# Patient Record
Sex: Female | Born: 1995 | Race: Black or African American | Hispanic: No | Marital: Married | Smoking: Never smoker
Health system: Southern US, Community
[De-identification: ages and names within clinical notes are randomized; demographics above are authoritative.]

## PROBLEM LIST (undated history)

## (undated) ENCOUNTER — Inpatient Hospital Stay (HOSPITAL_COMMUNITY): Payer: 59

## (undated) DIAGNOSIS — J302 Other seasonal allergic rhinitis: Secondary | ICD-10-CM

## (undated) HISTORY — PX: TONSILLECTOMY: SUR1361

---

## 2019-07-11 ENCOUNTER — Emergency Department (INDEPENDENT_AMBULATORY_CARE_PROVIDER_SITE_OTHER)
Admission: EM | Admit: 2019-07-11 | Discharge: 2019-07-11 | Disposition: A | Discharge status: Home / Self Care | Payer: Federal, State, Local not specified - PPO | Source: Home / Self Care

## 2019-07-11 ENCOUNTER — Other Ambulatory Visit: Payer: Self-pay

## 2019-07-11 ENCOUNTER — Encounter: Payer: Self-pay | Admitting: Emergency Medicine

## 2019-07-11 DIAGNOSIS — Z23 Encounter for immunization: Secondary | ICD-10-CM

## 2019-07-11 MED ORDER — TETANUS-DIPHTH-ACELL PERTUSSIS 5-2.5-18.5 LF-MCG/0.5 IM SUSP
0.5000 mL | Freq: Once | INTRAMUSCULAR | Status: AC
  Administered 2019-07-11: 0.5 mL via INTRAMUSCULAR

## 2019-07-11 NOTE — ED Triage Notes (Signed)
Patient here for immunization for college; tdap.

## 2019-07-24 ENCOUNTER — Other Ambulatory Visit: Payer: Self-pay

## 2019-07-25 ENCOUNTER — Ambulatory Visit (HOSPITAL_COMMUNITY)
Admission: EM | Admit: 2019-07-25 | Discharge: 2019-07-25 | Disposition: A | Discharge status: Home / Self Care | Payer: Federal, State, Local not specified - PPO | Attending: Emergency Medicine | Admitting: Emergency Medicine

## 2019-07-25 ENCOUNTER — Encounter (HOSPITAL_COMMUNITY): Payer: Self-pay

## 2019-07-25 ENCOUNTER — Emergency Department (HOSPITAL_COMMUNITY)
Admission: EM | Admit: 2019-07-25 | Discharge: 2019-07-25 | Discharge status: Against Medical Advice | Payer: Federal, State, Local not specified - PPO

## 2019-07-25 DIAGNOSIS — F419 Anxiety disorder, unspecified: Secondary | ICD-10-CM | POA: Diagnosis not present

## 2019-07-25 DIAGNOSIS — R079 Chest pain, unspecified: Secondary | ICD-10-CM

## 2019-07-25 NOTE — Discharge Instructions (Addendum)
Go to the emergency department if you have chest pain, shortness of breath, weakness, or other concerning symptoms.

## 2019-07-25 NOTE — ED Triage Notes (Signed)
Patient presents to Urgent Care with complaints of right arm and neck pain since last night while at work. Patient reports she knows "sometimes that can be a sign of a heart attack so I wanted to get checked cause I'm too young and fabulous for that" pt endorses hx of anxiety.

## 2019-07-25 NOTE — ED Provider Notes (Signed)
[place]    CSN: [id number] Arrival date & time: 07/25/19  1859      History   Chief Complaint Chief Complaint  Patient presents with  . Anxiety    HPI Tamara Rios is a 23 y.o. female.   Patient presents with anxiety and stress related to working full-time and going to school full-time.  She states her anxiety is causing her to have intermittent chest pain x several months.  She reports when she has these episodes, she has shortness of breath also.  She denies current symptoms.  No treatments attempted at home.  The history is provided by the patient.    History reviewed. No pertinent past medical history.  There are no active problems to display for this patient.   Past Surgical History:  Procedure Laterality Date  . TONSILLECTOMY      OB History   No obstetric history on file.      Home Medications    Prior to Admission medications   Not on File    Family History Family History  Problem Relation Age of Onset  . Healthy Mother   . Hypertension Father     Social History Social History   Tobacco Use  . Smoking status: Never Smoker  . Smokeless tobacco: Never Used  Substance Use Topics  . Alcohol use: Yes  . Drug use: Yes    Types: Marijuana     Allergies   Patient has no known allergies.   Review of Systems Review of Systems  Constitutional: Negative for chills and fever.  HENT: Negative for ear pain and sore throat.   Eyes: Negative for pain and visual disturbance.  Respiratory: Positive for shortness of breath. Negative for cough.   Cardiovascular: Positive for chest pain. Negative for palpitations.  Gastrointestinal: Negative for abdominal pain and vomiting.  Genitourinary: Negative for dysuria and hematuria.  Musculoskeletal: Negative for arthralgias and back pain.  Skin: Negative for color change and rash.  Neurological: Negative for seizures and syncope.  Psychiatric/Behavioral: The patient is nervous/anxious.    All other systems reviewed and are negative.    Physical Exam Triage Vital Signs ED Triage Vitals  Enc Vitals Group     BP 07/25/19 1937 132/75     Pulse Rate 07/25/19 1937 81     Resp 07/25/19 1937 16     Temp 07/25/19 1937 98.7 F (37.1 C)     Temp Source 07/25/19 1937 Temporal     SpO2 07/25/19 1937 100 %     Weight --      Height --      Head Circumference --      Peak Flow --      Pain Score 07/25/19 1936 0     Pain Loc --      Pain Edu? --      Excl. in [place]? --    No data found.  Updated Vital Signs BP 132/75 (BP Location: Right Arm)   Pulse 81   Temp 98.7 F (37.1 C) (Temporal)   Resp 16   SpO2 100%   Visual Acuity Right Eye Distance:   Left Eye Distance:   Bilateral Distance:    Right Eye Near:   Left Eye Near:    Bilateral Near:     Physical Exam Vitals signs and nursing note reviewed.  Constitutional:      General: She is not in acute distress.    Appearance: She is well-developed.  HENT:  Head: Normocephalic and atraumatic.     Mouth/Throat:     Mouth: Mucous membranes are moist.     Pharynx: Oropharynx is clear.  Eyes:     Conjunctiva/sclera: Conjunctivae normal.  Neck:     Musculoskeletal: Neck supple.  Cardiovascular:     Rate and Rhythm: Normal rate and regular rhythm.     Heart sounds: Normal heart sounds. No murmur.  Pulmonary:     Effort: Pulmonary effort is normal. No respiratory distress.     Breath sounds: Normal breath sounds.  Abdominal:     General: Bowel sounds are normal.     Palpations: Abdomen is soft.     Tenderness: There is no abdominal tenderness. There is no guarding or rebound.  Skin:    General: Skin is warm and dry.     Findings: No rash.  Neurological:     General: No focal deficit present.     Mental Status: She is alert and oriented to person, place, and time.     Sensory: No sensory deficit.     Motor: No weakness.     Gait: Gait normal.      UC Treatments / Results  Labs (all labs ordered are  listed, but only abnormal results are displayed) Labs Reviewed - No data to display  EKG   Radiology No results found.  Procedures Procedures (including critical care time)  Medications Ordered in UC Medications - No data to display  Initial Impression / Assessment and Plan / UC Course  I have reviewed the triage vital signs and the nursing notes.  Pertinent labs & imaging results that were available during my care of the patient were reviewed by me and considered in my medical decision making (see chart for details).    Chest pain.  Anxiety.  EKG shows normal sinus rhythm, rate 88, no ST elevation.  Discussed with patient that she should go to the emergency department if she is having chest pain, shortness of breath, weakness, or other concerning symptoms.  Discussed that she should follow-up with her primary care provider to discuss her anxiety and stress.  Patient agrees to plan of care.     Final Clinical Impressions(s) / UC Diagnoses   Final diagnoses:  Chest pain, unspecified type  Anxiety     Discharge Instructions     Go to the emergency department if you have chest pain, shortness of breath, weakness, or other concerning symptoms.        ED Prescriptions    None     PDMP not reviewed this encounter.   Mickie Bail, NP 07/25/19 2029

## 2020-05-08 ENCOUNTER — Ambulatory Visit: Payer: Federal, State, Local not specified - PPO | Attending: Internal Medicine

## 2020-05-08 DIAGNOSIS — Z20822 Contact with and (suspected) exposure to covid-19: Secondary | ICD-10-CM

## 2020-05-09 LAB — NOVEL CORONAVIRUS, NAA: SARS-CoV-2, NAA: NOT DETECTED

## 2020-05-09 LAB — SARS-COV-2, NAA 2 DAY TAT

## 2020-09-26 ENCOUNTER — Other Ambulatory Visit: Payer: Self-pay

## 2020-09-26 ENCOUNTER — Ambulatory Visit (HOSPITAL_COMMUNITY)
Admission: EM | Admit: 2020-09-26 | Discharge: 2020-09-26 | Disposition: A | Discharge status: Home / Self Care | Payer: Federal, State, Local not specified - PPO

## 2021-03-11 ENCOUNTER — Emergency Department (HOSPITAL_BASED_OUTPATIENT_CLINIC_OR_DEPARTMENT_OTHER)
Admission: EM | Admit: 2021-03-11 | Discharge: 2021-03-12 | Disposition: A | Discharge status: Home / Self Care | Payer: Federal, State, Local not specified - PPO | Attending: Emergency Medicine | Admitting: Emergency Medicine

## 2021-03-11 ENCOUNTER — Other Ambulatory Visit: Payer: Self-pay

## 2021-03-11 ENCOUNTER — Encounter (HOSPITAL_BASED_OUTPATIENT_CLINIC_OR_DEPARTMENT_OTHER): Payer: Self-pay | Admitting: *Deleted

## 2021-03-11 DIAGNOSIS — N946 Dysmenorrhea, unspecified: Secondary | ICD-10-CM | POA: Insufficient documentation

## 2021-03-11 DIAGNOSIS — R531 Weakness: Secondary | ICD-10-CM | POA: Diagnosis present

## 2021-03-11 DIAGNOSIS — R5381 Other malaise: Secondary | ICD-10-CM

## 2021-03-11 DIAGNOSIS — R5383 Other fatigue: Secondary | ICD-10-CM

## 2021-03-11 LAB — URINALYSIS, MICROSCOPIC (REFLEX)

## 2021-03-11 LAB — BASIC METABOLIC PANEL
Anion gap: 7 (ref 5–15)
BUN: 5 mg/dL — ABNORMAL LOW (ref 6–20)
CO2: 26 mmol/L (ref 22–32)
Calcium: 8.9 mg/dL (ref 8.9–10.3)
Chloride: 105 mmol/L (ref 98–111)
Creatinine, Ser: 0.82 mg/dL (ref 0.44–1.00)
GFR, Estimated: >60 mL/min (ref >60)
Glucose, Bld: 131 mg/dL — ABNORMAL HIGH (ref 70–99)
Potassium: 3.8 mmol/L (ref 3.5–5.1)
Sodium: 138 mmol/L (ref 135–145)

## 2021-03-11 LAB — CBC WITH DIFFERENTIAL/PLATELET
Abs Immature Granulocytes: 0.03 10*3/uL (ref 0.00–0.07)
Basophils Absolute: 0.1 10*3/uL (ref 0.0–0.1)
Basophils Relative: 0 %
Eosinophils Absolute: 0.1 10*3/uL (ref 0.0–0.5)
Eosinophils Relative: 1 %
HCT: 33.5 % — ABNORMAL LOW (ref 36.0–46.0)
Hemoglobin: 11 g/dL — ABNORMAL LOW (ref 12.0–15.0)
Immature Granulocytes: 0 %
Lymphocytes Relative: 37 %
Lymphs Abs: 4.3 10*3/uL — ABNORMAL HIGH (ref 0.7–4.0)
MCH: 29.9 pg (ref 26.0–34.0)
MCHC: 32.8 g/dL (ref 30.0–36.0)
MCV: 91 fL (ref 80.0–100.0)
Monocytes Absolute: 0.7 10*3/uL (ref 0.1–1.0)
Monocytes Relative: 6 %
Neutro Abs: 6.3 10*3/uL (ref 1.7–7.7)
Neutrophils Relative %: 56 %
Platelets: 269 10*3/uL (ref 150–400)
RBC: 3.68 MIL/uL — ABNORMAL LOW (ref 3.87–5.11)
RDW: 13.9 % (ref 11.5–15.5)
WBC: 11.6 10*3/uL — ABNORMAL HIGH (ref 4.0–10.5)
nRBC: 0 % (ref 0.0–0.2)

## 2021-03-11 LAB — URINALYSIS, ROUTINE W REFLEX MICROSCOPIC
Bilirubin Urine: NEGATIVE
Glucose, UA: NEGATIVE mg/dL
Ketones, ur: NEGATIVE mg/dL
Nitrite: NEGATIVE
Protein, ur: NEGATIVE mg/dL
Specific Gravity, Urine: 1.01 (ref 1.005–1.030)
pH: 5.5 (ref 5.0–8.0)

## 2021-03-11 LAB — PREGNANCY, URINE: Preg Test, Ur: NEGATIVE

## 2021-03-11 MED ORDER — ONDANSETRON 4 MG PO TBDP
8.0000 mg | ORAL_TABLET | Freq: Once | ORAL | Status: AC
  Administered 2021-03-11: 8 mg via ORAL
  Filled 2021-03-11: qty 2

## 2021-03-11 NOTE — ED Provider Notes (Signed)
[facility] Provider Note: Tamara Dell, MD, FACEP  CSN: [id number] MRN: [id number] ARRIVAL: 03/11/21 at 2242 ROOM: MH02/MH02   CHIEF COMPLAINT  Weakness   HISTORY OF PRESENT ILLNESS  03/11/21 11:30 PM Tamara Rios is a 25 y.o. female with about 2 hours of generalized weakness, nausea and some lower abdominal cramping.  Symptoms are not severe, and she rates her abdominal pain is about a 5 out of 10.  She is currently on her menses.  She is having no vomiting, diarrhea or dysuria.  She feels like she might be dehydrated because she has not been drinking as much as usual.  She would like her "blood counts" checked.   History reviewed. No pertinent past medical history.  Past Surgical History:  Procedure Laterality Date  . TONSILLECTOMY      Family History  Problem Relation Age of Onset  . Healthy Mother   . Hypertension Father     Social History   Tobacco Use  . Smoking status: Never Smoker  . Smokeless tobacco: Never Used  Vaping Use  . Vaping Use: Never used  Substance Use Topics  . Alcohol use: Yes  . Drug use: Not Currently    Types: Marijuana    Prior to Admission medications   Medication Sig Start Date End Date Taking? Authorizing Provider  ondansetron (ZOFRAN ODT) 8 MG disintegrating tablet Take 1 tablet (8 mg total) by mouth every 8 (eight) hours as needed for nausea or vomiting. 03/12/21  Yes Naziah Portee, MD    Allergies Patient has no known allergies.   REVIEW OF SYSTEMS  Negative except as noted here or in the History of Present Illness.   PHYSICAL EXAMINATION  Initial Vital Signs Blood pressure (!) 150/98, pulse 81, temperature 98.3 F (36.8 C), temperature source Oral, resp. rate 18, height 5' 6.5" (1.689 m), weight 86.5 kg, last menstrual period 03/10/2021, SpO2 100 %.  Examination General: Well-developed, well-nourished female in no acute distress; appearance consistent with age of record HENT: normocephalic;  atraumatic Eyes: pupils equal, round and reactive to light; extraocular muscles intact Neck: supple Heart: regular rate and rhythm Lungs: clear to auscultation bilaterally Abdomen: soft; nondistended; nontender; bowel sounds present Extremities: No deformity; full range of motion; pulses normal Neurologic: Awake, alert and oriented; motor function intact in all extremities and symmetric; no facial droop Skin: Warm and dry Psychiatric: Normal mood and affect   RESULTS  Summary of this visit's results, reviewed and interpreted by myself:   EKG Interpretation  Date/Time:    Ventricular Rate:    PR Interval:    QRS Duration:   QT Interval:    QTC Calculation:   R Axis:     Text Interpretation:        Laboratory Studies: Results for orders placed or performed during the hospital encounter of 03/11/21 (from the past 24 hour(s))  Urinalysis, Routine w reflex microscopic Urine, Clean Catch     Status: Abnormal   Collection Time: 03/11/21 10:53 PM  Result Value Ref Range   Color, Urine YELLOW YELLOW   APPearance CLEAR CLEAR   Specific Gravity, Urine 1.010 1.005 - 1.030   pH 5.5 5.0 - 8.0   Glucose, UA NEGATIVE NEGATIVE mg/dL   Hgb urine dipstick LARGE (A) NEGATIVE   Bilirubin Urine NEGATIVE NEGATIVE   Ketones, ur NEGATIVE NEGATIVE mg/dL   Protein, ur NEGATIVE NEGATIVE mg/dL   Nitrite NEGATIVE NEGATIVE   Leukocytes,Ua TRACE (A) NEGATIVE  Pregnancy, urine     Status:  None   Collection Time: 03/11/21 10:53 PM  Result Value Ref Range   Preg Test, Ur NEGATIVE NEGATIVE  Urinalysis, Microscopic (reflex)     Status: Abnormal   Collection Time: 03/11/21 10:53 PM  Result Value Ref Range   RBC / HPF 6-10 0 - 5 RBC/hpf   WBC, UA 0-5 0 - 5 WBC/hpf   Bacteria, UA RARE (A) NONE SEEN   Squamous Epithelial / LPF 0-5 0 - 5  CBC with Differential/Platelet     Status: Abnormal   Collection Time: 03/11/21 11:35 PM  Result Value Ref Range   WBC 11.6 (H) 4.0 - 10.5 K/uL   RBC 3.68 (L) 3.87  - 5.11 MIL/uL   Hemoglobin 11.0 (L) 12.0 - 15.0 g/dL   HCT 15.7 (L) 26.2 - 03.5 %   MCV 91.0 80.0 - 100.0 fL   MCH 29.9 26.0 - 34.0 pg   MCHC 32.8 30.0 - 36.0 g/dL   RDW 59.7 41.6 - 38.4 %   Platelets 269 150 - 400 K/uL   nRBC 0.0 0.0 - 0.2 %   Neutrophils Relative % 56 %   Neutro Abs 6.3 1.7 - 7.7 K/uL   Lymphocytes Relative 37 %   Lymphs Abs 4.3 (H) 0.7 - 4.0 K/uL   Monocytes Relative 6 %   Monocytes Absolute 0.7 0.1 - 1.0 K/uL   Eosinophils Relative 1 %   Eosinophils Absolute 0.1 0.0 - 0.5 K/uL   Basophils Relative 0 %   Basophils Absolute 0.1 0.0 - 0.1 K/uL   Immature Granulocytes 0 %   Abs Immature Granulocytes 0.03 0.00 - 0.07 K/uL  Basic metabolic panel     Status: Abnormal   Collection Time: 03/11/21 11:35 PM  Result Value Ref Range   Sodium 138 135 - 145 mmol/L   Potassium 3.8 3.5 - 5.1 mmol/L   Chloride 105 98 - 111 mmol/L   CO2 26 22 - 32 mmol/L   Glucose, Bld 131 (H) 70 - 99 mg/dL   BUN 5 (L) 6 - 20 mg/dL   Creatinine, Ser 5.36 0.44 - 1.00 mg/dL   Calcium 8.9 8.9 - 46.8 mg/dL   GFR, Estimated >03 >21 mL/min   Anion gap 7 5 - 15   Imaging Studies: No results found.  ED COURSE and MDM  Nursing notes, initial and subsequent vitals signs, including pulse oximetry, reviewed and interpreted by myself.  Vitals:   03/11/21 2249 03/11/21 2255 03/11/21 2356  BP:  (!) 150/98 (!) 147/89  Pulse:  81 80  Resp:  18 17  Temp:  98.3 F (36.8 C) 98.3 F (36.8 C)  TempSrc:  Oral Oral  SpO2:  100% 99%  Weight: 86.5 kg    Height: 5' 6.5" (1.689 m)     Medications  ondansetron (ZOFRAN-ODT) disintegrating tablet 8 mg (8 mg Oral Given 03/11/21 2354)   Patient advised of laboratory findings and slightly elevated white count.  This could indicate an early viral illness especially given the abdominal cramping.  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   PROCEDURES  Procedures   ED DIAGNOSES     ICD-10-CM   1. Malaise and  fatigue  R53.81    R53.83        Blandon Offerdahl, Jonny Ruiz, MD 03/12/21 [phone]

## 2021-03-11 NOTE — ED Triage Notes (Signed)
Lightheaded and weak for an hour.  Nausea. Abdominal pain.

## 2021-03-12 MED ORDER — ONDANSETRON 8 MG PO TBDP: 8.0000 mg | ORAL_TABLET | Freq: Three times a day (TID) | ORAL | 0 refills | Status: DC | PRN

## 2021-04-27 ENCOUNTER — Emergency Department (HOSPITAL_BASED_OUTPATIENT_CLINIC_OR_DEPARTMENT_OTHER): Payer: Federal, State, Local not specified - PPO

## 2021-04-27 ENCOUNTER — Encounter (HOSPITAL_BASED_OUTPATIENT_CLINIC_OR_DEPARTMENT_OTHER): Payer: Self-pay | Admitting: Emergency Medicine

## 2021-04-27 ENCOUNTER — Emergency Department (HOSPITAL_BASED_OUTPATIENT_CLINIC_OR_DEPARTMENT_OTHER)
Admission: EM | Admit: 2021-04-27 | Discharge: 2021-04-27 | Disposition: A | Discharge status: Home / Self Care | Payer: Federal, State, Local not specified - PPO | Attending: Emergency Medicine | Admitting: Emergency Medicine

## 2021-04-27 ENCOUNTER — Other Ambulatory Visit: Payer: Self-pay

## 2021-04-27 DIAGNOSIS — R1013 Epigastric pain: Secondary | ICD-10-CM | POA: Insufficient documentation

## 2021-04-27 DIAGNOSIS — R079 Chest pain, unspecified: Secondary | ICD-10-CM | POA: Insufficient documentation

## 2021-04-27 NOTE — ED Provider Notes (Signed)
[facility] EMERGENCY DEPARTMENT Provider Note   CSN: [id number] Arrival date & time: 04/27/21  2207     History Chief Complaint  Patient presents with   Chest Pain    Tamara Rios is a 25 y.o. female.  25 yo F with a chief complaints of left-sided chest pain this is pinpoint and sharp.  Seems to come and go last for seconds at a time.  Has been off and on for a little while now.  Nothing she does seems to make it better or worse.  She denies any issues with exercise denies any difficulty breathing with this denies cough congestion or fever.  Patient denies history of MI, denies hypertension hyperlipidemia diabetes or smoking.  Father had an MI in his 75s. Patient denies history of PE or DVT denies hemoptysis denies unilateral lower extremity edema denies recent surgery immobilization hospitalization estrogen use or history of cancer.    The history is provided by the patient and a friend.  Chest Pain Pain location:  L lateral chest Pain quality: sharp   Pain radiates to:  Does not radiate Pain severity:  Moderate Onset quality:  Gradual Duration:  2 weeks Timing:  Intermittent Progression:  Waxing and waning Chronicity:  New Relieved by:  Nothing Worsened by:  Nothing Ineffective treatments:  None tried Associated symptoms: no dizziness, no fever, no headache, no nausea, no palpitations, no shortness of breath and no vomiting       History reviewed. No pertinent past medical history.  There are no problems to display for this patient.   Past Surgical History:  Procedure Laterality Date   TONSILLECTOMY       OB History   No obstetric history on file.     Family History  Problem Relation Age of Onset   Healthy Mother    Hypertension Father     Social History   Tobacco Use   Smoking status: Never   Smokeless tobacco: Never  Vaping Use   Vaping Use: Never used  Substance Use Topics   Alcohol use: Yes   Drug use: Not Currently    Types:  Marijuana    Home Medications Prior to Admission medications   Medication Sig Start Date End Date Taking? Authorizing Provider  ondansetron (ZOFRAN ODT) 8 MG disintegrating tablet Take 1 tablet (8 mg total) by mouth every 8 (eight) hours as needed for nausea or vomiting. 03/12/21   Molpus, Jonny Ruiz, MD    Allergies    Patient has no known allergies.  Review of Systems   Review of Systems  Constitutional:  Negative for chills and fever.  HENT:  Negative for congestion and rhinorrhea.   Eyes:  Negative for redness and visual disturbance.  Respiratory:  Negative for shortness of breath and wheezing.   Cardiovascular:  Positive for chest pain. Negative for palpitations.  Gastrointestinal:  Negative for nausea and vomiting.  Genitourinary:  Negative for dysuria and urgency.  Musculoskeletal:  Negative for arthralgias and myalgias.  Skin:  Negative for pallor and wound.  Neurological:  Negative for dizziness and headaches.   Physical Exam Updated Vital Signs BP (!) 143/80 (BP Location: Left Arm)   Pulse 86   Temp 98.4 F (36.9 C) (Oral)   Resp 16   Ht 5' 6.5" (1.689 m)   Wt 81.6 kg   LMP 04/07/2021   SpO2 100%   BMI 28.62 kg/m   Physical Exam Vitals and nursing note reviewed.  Constitutional:      General: She  is not in acute distress.    Appearance: She is well-developed. She is not diaphoretic.  HENT:     Head: Normocephalic and atraumatic.  Eyes:     Pupils: Pupils are equal, round, and reactive to light.  Cardiovascular:     Rate and Rhythm: Normal rate and regular rhythm.     Heart sounds: No murmur heard.   No friction rub. No gallop.  Pulmonary:     Effort: Pulmonary effort is normal.     Breath sounds: No wheezing or rales.  Chest:     Chest wall: No tenderness.  Abdominal:     General: There is no distension.     Palpations: Abdomen is soft.     Tenderness: There is no abdominal tenderness.  Musculoskeletal:        General: No tenderness.     Cervical back:  Normal range of motion and neck supple.  Skin:    General: Skin is warm and dry.  Neurological:     Mental Status: She is alert and oriented to person, place, and time.  Psychiatric:        Behavior: Behavior normal.    ED Results / Procedures / Treatments   Labs (all labs ordered are listed, but only abnormal results are displayed) Labs Reviewed - No data to display  EKG EKG Interpretation  Date/Time:  Sunday April 27 2021 22:13:01 EDT Ventricular Rate:  89 PR Interval:  174 QRS Duration: 106 QT Interval:  350 QTC Calculation: 425 R Axis:   10 Text Interpretation: Normal sinus rhythm Incomplete right bundle branch block Borderline ECG No significant change since last tracing Confirmed by Rogina Schiano ([id number]) on 04/27/2021 10:31:37 PM  Radiology DG Chest Port 1 View  Result Date: 04/27/2021 CLINICAL DATA:  Chest pain EXAM: PORTABLE CHEST 1 VIEW COMPARISON:  None. FINDINGS: The heart size and mediastinal contours are within normal limits. Both lungs are clear. The visualized skeletal structures are unremarkable. IMPRESSION: No active disease. Electronically Signed   By: Kim  Fujinaga M.D.   On: 04/27/2021 22:39    Procedures Procedures   Medications Ordered in ED Medications - No data to display  ED Course  I have reviewed the triage vital signs and the nursing notes.  Pertinent labs & imaging results that were available during my care of the patient were reviewed by me and considered in my medical decision making (see chart for details).    MDM Rules/Calculators/A&P                          25  yo F with a chief complaints of left-sided chest pain.  This is sharp and intermittent lasting for seconds at a time.  She is well-appearing and nontoxic.  Chest x-ray viewed by me without focal infiltrate or pneumothorax.  EKG unremarkable.  She has no risk factors for heart disease.  I feel this is extremely unlikely for her to have an MI and do not feel that blood work is  warranted.  Patient is also PERC negative.  We will treat as possible reflux.  PCP follow-up.  When I brought up the possibility of reflux disease as problems she did tell me she has been trying to aggressively change her diet to get her bowels to move and has had some epigastric stomach discomfort off and on.  She is requesting referral to GI.  10:58 PM:  I have discussed the diagnosis/risks/treatment options with the patient and friend  and believe the pt to be eligible for discharge home to follow-up with. We also discussed returning to the ED immediately if new or worsening sx occur. We discussed the sx which are most concerning (e.g., sudden worsening pain, fever, inability to tolerate by mouth) that necessitate immediate return. Medications administered to the patient during their visit and any new prescriptions provided to the patient are listed below.  Medications given during this visit Medications - No data to display   The patient appears reasonably screen and/or stabilized for discharge and I doubt any other medical condition or other [facility] requiring further screening, evaluation, or treatment in the ED at this time prior to discharge.   Final Clinical Impression(s) / ED Diagnoses Final diagnoses:  Nonspecific chest pain    Rx / DC Orders ED Discharge Orders     None        Melene Plan, DO 04/27/21 2258

## 2021-04-27 NOTE — ED Triage Notes (Signed)
Reports left sided chest pain that is sharp in nature that has been going on for several days.  Denies any sob.  Does endorse some nausea.

## 2021-04-27 NOTE — Discharge Instructions (Addendum)
Try pepcid or tagamet up to twice a day.  Try to avoid things that may make this worse, most commonly these are spicy foods tomato based products fatty foods chocolate and peppermint.  Alcohol and tobacco can also make this worse.  Return to the emergency department for sudden worsening pain fever or inability to eat or drink.  

## 2021-05-09 ENCOUNTER — Encounter: Payer: Self-pay | Admitting: Obstetrics & Gynecology

## 2021-05-09 ENCOUNTER — Ambulatory Visit: Payer: Federal, State, Local not specified - PPO | Admitting: Obstetrics & Gynecology

## 2021-05-09 ENCOUNTER — Other Ambulatory Visit: Payer: Self-pay

## 2021-05-09 VITALS — BP 125/78 | HR 73 | Wt 188.0 lb

## 2021-05-09 DIAGNOSIS — Z3202 Encounter for pregnancy test, result negative: Secondary | ICD-10-CM | POA: Diagnosis not present

## 2021-05-09 DIAGNOSIS — Z3009 Encounter for other general counseling and advice on contraception: Secondary | ICD-10-CM | POA: Diagnosis not present

## 2021-05-09 DIAGNOSIS — Z30017 Encounter for initial prescription of implantable subdermal contraceptive: Secondary | ICD-10-CM

## 2021-05-09 LAB — POCT URINE PREGNANCY: Preg Test, Ur: NEGATIVE

## 2021-05-09 MED ORDER — ETONOGESTREL 68 MG ~~LOC~~ IMPL
68.0000 mg | DRUG_IMPLANT | Freq: Once | SUBCUTANEOUS | Status: AC
  Administered 2021-05-09: 68 mg via SUBCUTANEOUS

## 2021-05-09 NOTE — Progress Notes (Signed)
History:  25 y.o. G0. Pt here to discuss contraception. She had a well woman exam recently in [place]. She was recently married and wants to get back on contraception. She prev had the Nexplanon which worked well for her.  She wants to complete school and travel before having children.   The following portions of the patient's history were reviewed and updated as appropriate: allergies, current medications, past family history, past medical history, past social history, past surgical history and problem list.  Review of Systems:  Pertinent items are noted in HPI.    Objective:  Physical Exam Blood pressure 125/78, pulse 73, weight 188 lb (85.3 kg), last menstrual period 05/06/2021.  CONSTITUTIONAL: Well-developed, well-nourished female in no acute distress.  HENT:  Normocephalic, atraumatic EYES: Conjunctivae and EOM are normal. No scleral icterus.  NECK: Normal range of motion SKIN: Skin is warm and dry. No rash noted. Not diaphoretic.No pallor. NEUROLGIC: Alert and oriented to person, place, and time. Normal coordination.  Pelvic: not done  Patient given informed consent, she signed consent form. Pregnancy test was negative.  Appropriate time out taken.  Patient's left arm was prepped and draped in the usual sterile fashion.. The ruler used to measure and mark insertion area.  Patient was prepped with alcohol swab and then injected with 5 ml of 1 % lidocaine.  She was prepped with betadine, Nexplanon removed from packaging,  Device confirmed in needle, then inserted full length of needle and withdrawn per handbook instructions.  There was minimal blood loss.  Patient insertion site covered with guaze and a pressure bandage to reduce any bruising.      Assessment & Plan:  Contraception management   The patient tolerated the procedure well and was given post procedure instructions. Return in about one month prn  for Nexplanon check.  Evon Lopezperez L. Harraway-Smith, M.D., [place]

## 2021-05-09 NOTE — Progress Notes (Signed)
Discuss birth control options 

## 2021-05-09 NOTE — Addendum Note (Signed)
Addended by: Cheree Ditto, Francile Woolford A on: 05/09/2021 10:44 AM   Modules accepted: Orders

## 2021-05-13 ENCOUNTER — Encounter: Payer: Self-pay | Admitting: General Practice

## 2021-05-13 ENCOUNTER — Other Ambulatory Visit: Payer: Self-pay

## 2021-05-13 ENCOUNTER — Ambulatory Visit (INDEPENDENT_AMBULATORY_CARE_PROVIDER_SITE_OTHER): Payer: Federal, State, Local not specified - PPO

## 2021-05-13 VITALS — BP 129/73 | HR 81 | Wt 188.0 lb

## 2021-05-13 DIAGNOSIS — Z30011 Encounter for initial prescription of contraceptive pills: Secondary | ICD-10-CM | POA: Diagnosis not present

## 2021-05-13 DIAGNOSIS — Z3046 Encounter for surveillance of implantable subdermal contraceptive: Secondary | ICD-10-CM

## 2021-05-13 MED ORDER — DESOGESTREL-ETHINYL ESTRADIOL 0.15-0.02/0.01 MG (21/5) PO TABS: 1.0000 | ORAL_TABLET | Freq: Every day | ORAL | 3 refills | Status: DC

## 2021-05-13 NOTE — Progress Notes (Signed)
GYNECOLOGY OFFICE VISIT NOTE  History:  Tamara Rios is a 25 y.o. [phone] here today for Nexplanon removal. She requests removal due to pain and swelling at the insertion site.  She reports this has not occurred in the past and she does not like said side effect.  Patient states she would like to start oral contraception. She reports last sexual encounter was yesterday. Patient denies any abnormal vaginal discharge, bleeding, pelvic pain or other concerns.   No past medical history on file.  Past Surgical History:  Procedure Laterality Date   TONSILLECTOMY      The following portions of the patient's history were reviewed and updated as appropriate: allergies, current medications, past family history, past medical history, past social history, past surgical history and problem list.   Health Maintenance:  No pap on file.  No mammogram history d/t age.   Review of Systems:  General ROS: negative  Genito-Urinary ROS: no dysuria, trouble voiding, or hematuria  Objective:  Vitals: BP 129/73   Pulse 81   Wt 188 lb (85.3 kg)   LMP 05/06/2021   BMI 29.89 kg/m   Physical Exam Exam conducted with a chaperone present.  Constitutional:      Appearance: Normal appearance.  HENT:     Head: Normocephalic and atraumatic.  Eyes:     Conjunctiva/sclera: Conjunctivae normal.  Cardiovascular:     Rate and Rhythm: Normal rate.  Pulmonary:     Effort: Pulmonary effort is normal. No respiratory distress.  Musculoskeletal:        General: Normal range of motion.     Cervical back: Normal range of motion.  Skin:    General: Skin is warm and dry.     Findings: Bruising (Left arm in forcep area; reddish yellow. Nontender.  Swelling noted) present.  Neurological:     Mental Status: She is alert and oriented to person, place, and time.  Psychiatric:        Mood and Affect: Mood normal.        Behavior: Behavior normal.        Thought Content: Thought content normal.     Nexplanon  Removal Procedure:  Tamara Rios requests removal of Nexplanon for pain and discomfort at insertion site.  She understands that without another form of birth control method pregnancy can ensue immediately after removal. Patient also understands that removal can result in blood loss, infection at removal site, and pain.  Patient verbalizes understanding for all risks as stated above and in the consent and desires to proceed with removal.   Appropriate time out taken.  Patient identified, informed consent performed, consent signed.  Nexplanon site identified; left arm.  Area prepped in usual sterile fashon. 1.5 ml of 1% lidocaine with epi was used to anesthetize the area at the distal end of the implant. A small stab incision was made right beside the implant on the distal portion.  The Nexplanon rod was visualized within the sheath. The sheath was gently removed with the scalpel, resulting in the Nexplanon ejecting from the site without difficulty. There was minimal blood loss and no complications.  Steri-strips were applied over the small incision.  A pressure bandage was applied to reduce any further bruising.   The patient tolerated the procedure well and was given post procedure instructions to include: *Remove of pressure dressing and band-aid in 12-24 hours. *Remove of steri-strips after no more than 7 days. *Condom usage encouraged as patient does not desire contraception at this time. *  Tylenol or ibuprofen for insertion pain/discomfort. *Call for any other questions, concerns, or complications.   Assessment & Plan:  25 year old Nexplanon Removal Initiation of Oral Contraception   -Cautioned that bruising and swelling may continue despite removal -Instructed to start oral contraception today. -Cautioned that pregnancy can occur considering last sex yesterday and removal today. -Rx for Mircette sent to pharmacy on file. Disp 1, RF 3 -Plan to follow up in one month for BP check with  refill on BC Pill if happy with method. -Patient also in need of pap smear as no records.   Total face-to-face time with patient: 15 minutes   Cherre Robins MSN, CNM 05/13/2021

## 2021-06-09 ENCOUNTER — Ambulatory Visit: Payer: Federal, State, Local not specified - PPO

## 2021-06-19 ENCOUNTER — Ambulatory Visit (INDEPENDENT_AMBULATORY_CARE_PROVIDER_SITE_OTHER): Payer: Federal, State, Local not specified - PPO

## 2021-06-19 ENCOUNTER — Other Ambulatory Visit: Payer: Self-pay

## 2021-06-19 ENCOUNTER — Other Ambulatory Visit (HOSPITAL_COMMUNITY)
Admission: RE | Admit: 2021-06-19 | Discharge: 2021-06-19 | Disposition: A | Discharge status: Home / Self Care | Payer: Federal, State, Local not specified - PPO | Source: Ambulatory Visit | Attending: Family Medicine | Admitting: Family Medicine

## 2021-06-19 VITALS — BP 128/83 | HR 86 | Ht 67.0 in | Wt 189.0 lb

## 2021-06-19 DIAGNOSIS — N898 Other specified noninflammatory disorders of vagina: Secondary | ICD-10-CM

## 2021-06-19 DIAGNOSIS — R300 Dysuria: Secondary | ICD-10-CM

## 2021-06-19 NOTE — Progress Notes (Signed)
SUBJECTIVE: Tamara Rios is a 25 y.o. female who complains of urinary frequency, urgency and dysuria x 3 days, without flank pain, fever, chills,or bleeding. Pt requests to be checked for BV and yeast. Pt states she has some discharge but denies odor.  OBJECTIVE: Appears well, in no apparent distress.  Vital signs are normal. Urine dipstick shows positive for WBC's.    ASSESSMENT: Burning with urination.  PLAN: Pt declines STI testing. Wet prep and Urine culture was sent to the lab.Treatment per orders.  Call or return to clinic prn if these symptoms worsen or fail to improve as anticipated. 3

## 2021-06-22 LAB — URINE CULTURE

## 2021-06-22 NOTE — Progress Notes (Signed)
Chart reviewed - agree with CMA/RN documentation.  ° °

## 2021-06-25 LAB — CERVICOVAGINAL ANCILLARY ONLY
Bacterial Vaginitis (gardnerella): POSITIVE — AB
Candida Glabrata: NEGATIVE
Candida Vaginitis: POSITIVE — AB
Comment: NEGATIVE
Comment: NEGATIVE
Comment: NEGATIVE

## 2021-06-26 MED ORDER — FLUCONAZOLE 150 MG PO TABS: 150.0000 mg | ORAL_TABLET | Freq: Once | ORAL | 3 refills | Status: AC

## 2021-06-26 MED ORDER — METRONIDAZOLE 500 MG PO TABS: 500.0000 mg | ORAL_TABLET | Freq: Two times a day (BID) | ORAL | 0 refills | Status: DC

## 2021-08-21 ENCOUNTER — Other Ambulatory Visit: Payer: Self-pay

## 2021-08-21 ENCOUNTER — Ambulatory Visit
Admission: EM | Admit: 2021-08-21 | Discharge: 2021-08-21 | Disposition: A | Discharge status: Home / Self Care | Payer: Federal, State, Local not specified - PPO | Attending: Emergency Medicine | Admitting: Emergency Medicine

## 2021-08-21 DIAGNOSIS — R8290 Unspecified abnormal findings in urine: Secondary | ICD-10-CM

## 2021-08-21 DIAGNOSIS — R110 Nausea: Secondary | ICD-10-CM | POA: Diagnosis present

## 2021-08-21 DIAGNOSIS — K2100 Gastro-esophageal reflux disease with esophagitis, without bleeding: Secondary | ICD-10-CM | POA: Diagnosis not present

## 2021-08-21 LAB — POCT URINALYSIS DIP (MANUAL ENTRY)
Bilirubin, UA: NEGATIVE
Blood, UA: NEGATIVE
Glucose, UA: NEGATIVE mg/dL
Ketones, POC UA: NEGATIVE mg/dL
Leukocytes, UA: NEGATIVE
Nitrite, UA: NEGATIVE
Protein Ur, POC: NEGATIVE mg/dL
Spec Grav, UA: 1.015 (ref 1.010–1.025)
Urobilinogen, UA: 0.2 E.U./dL — AB
pH, UA: 8.5 — AB (ref 5.0–8.0)

## 2021-08-21 LAB — POCT URINE PREGNANCY: Preg Test, Ur: NEGATIVE

## 2021-08-21 MED ORDER — IPRATROPIUM BROMIDE 0.03 % NA SOLN
2.0000 | Freq: Two times a day (BID) | NASAL | 0 refills | Status: DC
Start: 2021-08-21 — End: 2021-09-26

## 2021-08-21 MED ORDER — LANSOPRAZOLE 30 MG PO CPDR: 30.0000 mg | DELAYED_RELEASE_CAPSULE | Freq: Every day | ORAL | 2 refills | Status: DC

## 2021-08-21 MED ORDER — FLUTICASONE PROPIONATE 50 MCG/ACT NA SUSP: 2.0000 | Freq: Every day | NASAL | 0 refills | Status: DC

## 2021-08-21 NOTE — ED Triage Notes (Signed)
Pt reports having nausea and lightheadedness, acid reflux. Pt also reports having some abd discomfort at times.   Started: a few weeks ago

## 2021-08-21 NOTE — ED Provider Notes (Addendum)
[facility]    CSN: [id number] Arrival date & time: 08/21/21  1515   History   Chief Complaint Chief Complaint  Patient presents with   Nausea   Dizziness   HPI Tamara Rios is a 25 y.o. female. Pt reports having nausea and lightheadedness, acid reflux for the past several weeks. Pt also reports having some abd discomfort at times.  Patient reports a history of gastroesophageal reflux disease previously on medication which she discontinued when she made lifestyle modifications.  Patient states she is married, currently sexually active and not using any form of birth control.  Patient reports a history of allergies however states they occur in the spring.  Urine pregnancy test today is negative.  Patient had a significantly elevated pH on urinalysis along with bilirubin in her urine.  Patient denies a history of kidney disease.  The history is provided by the patient.   History reviewed. No pertinent past medical history. There are no problems to display for this patient.  Past Surgical History:  Procedure Laterality Date   TONSILLECTOMY     OB History   No obstetric history on file.    Home Medications    Prior to Admission medications   Medication Sig Start Date End Date Taking? Authorizing Provider  fluticasone (FLONASE) 50 MCG/ACT nasal spray Place 2 sprays into both nostrils daily. 08/21/21 09/20/21 Yes Theadora Rama Scales, PA-C  ipratropium (ATROVENT) 0.03 % nasal spray Place 2 sprays into both nostrils every 12 (twelve) hours. 08/21/21  Yes Theadora Rama Scales, PA-C  lansoprazole (PREVACID) 30 MG capsule Take 1 capsule (30 mg total) by mouth daily with breakfast. 08/21/21 11/19/21 Yes Theadora Rama Scales, PA-C   Family History Family History  Problem Relation Age of Onset   Healthy Mother    Hypertension Father    Social History Social History   Tobacco Use   Smoking status: Never   Smokeless tobacco: Never  Vaping Use   Vaping Use:  Never used  Substance Use Topics   Alcohol use: Yes   Drug use: Not Currently    Types: Marijuana    Comment: Patient states she discontinued marijuana use 3 years ago   Allergies   Peanut-containing drug products  Review of Systems Review of Systems Pertinent findings noted in history of present illness.   Physical Exam Triage Vital Signs ED Triage Vitals  Enc Vitals Group     BP 08/08/21 0827 (!) 147/82     Pulse Rate 08/08/21 0827 72     Resp 08/08/21 0827 18     Temp 08/08/21 0827 98.3 F (36.8 C)     Temp Source 08/08/21 0827 Oral     SpO2 08/08/21 0827 98 %     Weight --      Height --      Head Circumference --      Peak Flow --      Pain Score 08/08/21 0826 5     Pain Loc --      Pain Edu? --      Excl. in GC? --    No data found.  Updated Vital Signs BP 123/83 (BP Location: Left Arm)   Pulse 92   Temp 98.4 F (36.9 C) (Oral)   Resp 18   LMP 08/10/2021   SpO2 98%   Visual Acuity Right Eye Distance:   Left Eye Distance:   Bilateral Distance:    Right Eye Near:   Left Eye Near:    Bilateral  Near:     Physical Exam Vitals and nursing note reviewed.  Constitutional:      General: She is not in acute distress.    Appearance: Normal appearance. She is not ill-appearing.  HENT:     Head: Normocephalic and atraumatic.  Eyes:     General: Lids are normal.        Right eye: No discharge.        Left eye: No discharge.     Extraocular Movements: Extraocular movements intact.     Conjunctiva/sclera: Conjunctivae normal.     Right eye: Right conjunctiva is not injected.     Left eye: Left conjunctiva is not injected.  Neck:     Trachea: Trachea and phonation normal.  Cardiovascular:     Rate and Rhythm: Normal rate and regular rhythm.     Pulses: Normal pulses.     Heart sounds: Normal heart sounds. No murmur heard.   No friction rub. No gallop.  Pulmonary:     Effort: Pulmonary effort is normal. No accessory muscle usage, prolonged expiration  or respiratory distress.     Breath sounds: Normal breath sounds. No stridor, decreased air movement or transmitted upper airway sounds. No decreased breath sounds, wheezing, rhonchi or rales.  Chest:     Chest wall: No tenderness.  Abdominal:     General: Abdomen is flat. Bowel sounds are normal. There is no distension.     Palpations: Abdomen is soft.     Tenderness: There is no abdominal tenderness. There is no right CVA tenderness or left CVA tenderness.     Hernia: No hernia is present.  Musculoskeletal:        General: Normal range of motion.     Cervical back: Normal range of motion and neck supple. Normal range of motion.  Lymphadenopathy:     Cervical: No cervical adenopathy.  Skin:    General: Skin is warm and dry.     Findings: No erythema or rash.  Neurological:     General: No focal deficit present.     Mental Status: She is alert and oriented to person, place, and time.  Psychiatric:        Mood and Affect: Mood normal.        Behavior: Behavior normal.   UC Treatments / Results  Labs (all labs ordered are listed, but only abnormal results are displayed)  Labs Reviewed  POCT URINALYSIS DIP (MANUAL ENTRY) - Abnormal; Notable for the following components:      Result Value   pH, UA 8.5 (*)    Urobilinogen, UA 0.2 (*)    All other components within normal limits  COVID-19, FLU A+B NAA  URINE CULTURE  CBC WITH DIFFERENTIAL/PLATELET  COMPREHENSIVE METABOLIC PANEL  POCT URINE PREGNANCY    EKG  Radiology No results found.  Procedures Procedures (including critical care time)  Medications Ordered in UC Medications - No data to display  Initial Impression / Assessment and Plan / UC Course  I have reviewed the triage vital signs and the nursing notes.  Pertinent labs & imaging results that were available during my care of the patient were reviewed by me and considered in my medical decision making (see chart for details).      Physical exam that is  unremarkable.  Patient was advised of her abnormal urinalysis results.  CMP and CBC were performed, patient advised to be notified of those results as well and if treatment is needed with that will be  provided for as well.  I have initiated a request to help her find primary care provider.  I also prescribed patient some Flonase and Atrovent to treat her allergic rhinitis and advised her to begin a 53-month course of Prevacid to get her heartburn under control.  Final Clinical Impressions(s) / UC Diagnoses   Final diagnoses:  Nausea without vomiting  Gastroesophageal reflux disease with esophagitis without hemorrhage  Abnormal result on screening urine test     Discharge Instructions      The urinalysis performed in the office today was concerning for an elevated pH in the presence of bilirubin.  These results warrant further investigation with a urine culture, evaluation of your kidney function and a complete blood count, these last 2 with a blood sample.  The results of these tests will be made available to you once they are received, initially they will be posted to your MyChart.  If any of your results are abnormal you will receive a phone call and if further treatment is needed, this will be provided for you.  Your urine pregnancy test today is negative.  In the meantime, I would like you to begin taking medication to control the amount of acid that you produce in your stomach, this medication is Prevacid, it is a safe medication that we give to small children all people like, and it has the fewest known drug to drug interactions of medications in its class, proton pump inhibitors.  I would like for the you take this medication at least for 3 months to allow the esophageal irritation that you are experiencing, heartburn, to completely subside and for the lining of your esophagus to completely heal.  I have initiated a request to help you find a primary care provider so that you can follow-up  on your heartburn symptoms and your abnormal urinalysis findings today.  Finally, on physical exam today I noticed that you are experiencing some subclinical allergic rhinitis.  If you are interested in treating this, I have sent prescriptions for both Flonase and Atrovent to your pharmacy, these are both nasal sprays and minimally absorbed into the body if at all.  There are topical treatments that help reduce inflammation in the nose and reduce mucus production which can cause postnasal drip, ear fullness and cough.     ED Prescriptions     Medication Sig Dispense Auth. Provider   lansoprazole (PREVACID) 30 MG capsule Take 1 capsule (30 mg total) by mouth daily with breakfast. 30 capsule Theadora Rama Scales, PA-C   fluticasone (FLONASE) 50 MCG/ACT nasal spray Place 2 sprays into both nostrils daily. 18 mL Theadora Rama Scales, PA-C   ipratropium (ATROVENT) 0.03 % nasal spray Place 2 sprays into both nostrils every 12 (twelve) hours. 30 mL Theadora Rama Scales, PA-C      PDMP not reviewed this encounter.  Disposition Upon Discharge:  Patient presented with an acute illness with associated systemic symptoms and significant discomfort requiring urgent management. In my opinion, this is a condition that a prudent lay person (someone who possesses an average knowledge of health and medicine) may potentially expect to result in complications if not addressed urgently such as respiratory distress, impairment of bodily function or dysfunction of bodily organs.   Routine symptom specific, illness specific and/or disease specific instructions were discussed with the patient and/or caregiver at length.   As such, the patient has been evaluated and assessed, work-up was performed and treatment was provided in alignment with urgent care protocols and  evidence based medicine.  Patient/parent/caregiver has been advised that the patient may require follow up for further testing and treatment if the  symptoms continue in spite of treatment, as clinically indicated and appropriate.  Patient was tested for COVID-19, Influenza and/or RSV, the patient/parent/guardian was advised to isolate at home pending the results of his/her diagnostic coronavirus test and potentially longer if they're positive. Advised pt that if his/her COVID-19 test returns positive, it's recommended to self-isolate for at least 10 days after symptoms first appeared AND until fever-free for 24 hours without fever reducer AND other symptoms have improved or resolved. Discussed self-isolation recommendations as well as instructions for household member/close contacts as per the [facility] and [place] [facility], and also gave patient the COVID packet with this information.  Patient/parent/caregiver has been advised to return to the [facility] or PCP in 3-5 days if no better; to PCP or the Emergency Department if new signs and symptoms develop, or if the current signs or symptoms continue to change or worsen for further workup, evaluation and treatment as clinically indicated and appropriate  The patient will follow up with their current PCP if and as advised. If the patient does not currently have a PCP we will assist them in obtaining one.   Patient/parent/caregiver verbalized understanding and agreement of plan as discussed.  All questions were addressed during visit.  Please see discharge instructions below for further details of plan.  Condition: stable for discharge home Home: take medications as prescribed; routine discharge instructions as discussed; follow up as advised.    Theadora Rama Scales, PA-C 08/21/21 1705    Theadora Rama Scales, PA-C 08/21/21 1708

## 2021-08-21 NOTE — Discharge Instructions (Addendum)
The urinalysis performed in the office today was concerning for an elevated pH in the presence of bilirubin.  These results warrant further investigation with a urine culture, evaluation of your kidney function and a complete blood count, these last 2 with a blood sample.  The results of these tests will be made available to you once they are received, initially they will be posted to your MyChart.  If any of your results are abnormal you will receive a phone call and if further treatment is needed, this will be provided for you.  Your urine pregnancy test today is negative.  In the meantime, I would like you to begin taking medication to control the amount of acid that you produce in your stomach, this medication is Prevacid, it is a safe medication that we give to small children all people like, and it has the fewest known drug to drug interactions of medications in its class, proton pump inhibitors.  I would like for the you take this medication at least for 3 months to allow the esophageal irritation that you are experiencing, heartburn, to completely subside and for the lining of your esophagus to completely heal.  I have initiated a request to help you find a primary care provider so that you can follow-up on your heartburn symptoms and your abnormal urinalysis findings today.  Finally, on physical exam today I noticed that you are experiencing some subclinical allergic rhinitis.  If you are interested in treating this, I have sent prescriptions for both Flonase and Atrovent to your pharmacy, these are both nasal sprays and minimally absorbed into the body if at all.  There are topical treatments that help reduce inflammation in the nose and reduce mucus production which can cause postnasal drip, ear fullness and cough.

## 2021-08-22 LAB — COMPREHENSIVE METABOLIC PANEL
ALT: 13 IU/L (ref 0–32)
AST: 24 IU/L (ref 0–40)
Albumin/Globulin Ratio: 1.6 (ref 1.2–2.2)
Albumin: 4.5 g/dL (ref 3.9–5.0)
Alkaline Phosphatase: 58 IU/L (ref 44–121)
BUN/Creatinine Ratio: 13 (ref 9–23)
BUN: 9 mg/dL (ref 6–20)
Bilirubin Total: 0.2 mg/dL (ref 0.0–1.2)
CO2: 24 mmol/L (ref 20–29)
Calcium: 9.5 mg/dL (ref 8.7–10.2)
Chloride: 102 mmol/L (ref 96–106)
Creatinine, Ser: 0.68 mg/dL (ref 0.57–1.00)
Globulin, Total: 2.9 g/dL (ref 1.5–4.5)
Glucose: 93 mg/dL (ref 70–99)
Potassium: 4.1 mmol/L (ref 3.5–5.2)
Sodium: 140 mmol/L (ref 134–144)
Total Protein: 7.4 g/dL (ref 6.0–8.5)
eGFR: 124 mL/min/{1.73_m2} (ref >59)

## 2021-08-22 LAB — CBC WITH DIFFERENTIAL/PLATELET
Basophils Absolute: 0 10*3/uL (ref 0.0–0.2)
Basos: 0 %
EOS (ABSOLUTE): 0.1 10*3/uL (ref 0.0–0.4)
Eos: 1 %
Hematocrit: 35.8 % (ref 34.0–46.6)
Hemoglobin: 11.6 g/dL (ref 11.1–15.9)
Immature Grans (Abs): 0 10*3/uL (ref 0.0–0.1)
Immature Granulocytes: 0 %
Lymphocytes Absolute: 3.6 10*3/uL — ABNORMAL HIGH (ref 0.7–3.1)
Lymphs: 33 %
MCH: 28.2 pg (ref 26.6–33.0)
MCHC: 32.4 g/dL (ref 31.5–35.7)
MCV: 87 fL (ref 79–97)
Monocytes Absolute: 0.5 10*3/uL (ref 0.1–0.9)
Monocytes: 5 %
Neutrophils Absolute: 6.5 10*3/uL (ref 1.4–7.0)
Neutrophils: 61 %
Platelets: 290 10*3/uL (ref 150–450)
RBC: 4.12 x10E6/uL (ref 3.77–5.28)
RDW: 13.6 % (ref 11.7–15.4)
WBC: 10.8 10*3/uL (ref 3.4–10.8)

## 2021-08-22 LAB — COVID-19, FLU A+B NAA
Influenza A, NAA: NOT DETECTED
Influenza B, NAA: NOT DETECTED
SARS-CoV-2, NAA: NOT DETECTED

## 2021-08-24 LAB — URINE CULTURE: Culture: NO GROWTH

## 2021-09-26 ENCOUNTER — Emergency Department (HOSPITAL_BASED_OUTPATIENT_CLINIC_OR_DEPARTMENT_OTHER)
Admission: EM | Admit: 2021-09-26 | Discharge: 2021-09-26 | Disposition: A | Discharge status: Home / Self Care | Payer: Federal, State, Local not specified - PPO | Attending: Emergency Medicine | Admitting: Emergency Medicine

## 2021-09-26 ENCOUNTER — Encounter (HOSPITAL_BASED_OUTPATIENT_CLINIC_OR_DEPARTMENT_OTHER): Payer: Self-pay

## 2021-09-26 ENCOUNTER — Other Ambulatory Visit: Payer: Self-pay

## 2021-09-26 ENCOUNTER — Ambulatory Visit (INDEPENDENT_AMBULATORY_CARE_PROVIDER_SITE_OTHER)
Admission: EM | Admit: 2021-09-26 | Discharge: 2021-09-26 | Disposition: A | Discharge status: Home / Self Care | Payer: Federal, State, Local not specified - PPO | Source: Home / Self Care

## 2021-09-26 DIAGNOSIS — U071 COVID-19: Secondary | ICD-10-CM | POA: Diagnosis not present

## 2021-09-26 DIAGNOSIS — Z91010 Allergy to peanuts: Secondary | ICD-10-CM | POA: Diagnosis not present

## 2021-09-26 DIAGNOSIS — R110 Nausea: Secondary | ICD-10-CM | POA: Insufficient documentation

## 2021-09-26 DIAGNOSIS — R109 Unspecified abdominal pain: Secondary | ICD-10-CM

## 2021-09-26 DIAGNOSIS — R1084 Generalized abdominal pain: Secondary | ICD-10-CM | POA: Insufficient documentation

## 2021-09-26 LAB — POCT URINALYSIS DIP (MANUAL ENTRY)
Bilirubin, UA: NEGATIVE
Blood, UA: NEGATIVE
Glucose, UA: NEGATIVE mg/dL
Leukocytes, UA: NEGATIVE
Nitrite, UA: NEGATIVE
Spec Grav, UA: 1.025 (ref 1.010–1.025)
Urobilinogen, UA: 0.2 E.U./dL
pH, UA: 6 (ref 5.0–8.0)

## 2021-09-26 LAB — CBC WITH DIFFERENTIAL/PLATELET
Abs Immature Granulocytes: 0.05 10*3/uL (ref 0.00–0.07)
Basophils Absolute: 0 10*3/uL (ref 0.0–0.1)
Basophils Relative: 0 %
Eosinophils Absolute: 0.1 10*3/uL (ref 0.0–0.5)
Eosinophils Relative: 1 %
HCT: 35.1 % — ABNORMAL LOW (ref 36.0–46.0)
Hemoglobin: 11.5 g/dL — ABNORMAL LOW (ref 12.0–15.0)
Immature Granulocytes: 0 %
Lymphocytes Relative: 28 %
Lymphs Abs: 3.4 10*3/uL (ref 0.7–4.0)
MCH: 28.7 pg (ref 26.0–34.0)
MCHC: 32.8 g/dL (ref 30.0–36.0)
MCV: 87.5 fL (ref 80.0–100.0)
Monocytes Absolute: 0.7 10*3/uL (ref 0.1–1.0)
Monocytes Relative: 6 %
Neutro Abs: 8 10*3/uL — ABNORMAL HIGH (ref 1.7–7.7)
Neutrophils Relative %: 65 %
Platelets: 286 10*3/uL (ref 150–400)
RBC: 4.01 MIL/uL (ref 3.87–5.11)
RDW: 15 % (ref 11.5–15.5)
WBC: 12.2 10*3/uL — ABNORMAL HIGH (ref 4.0–10.5)
nRBC: 0 % (ref 0.0–0.2)

## 2021-09-26 LAB — COMPREHENSIVE METABOLIC PANEL
ALT: 12 U/L (ref 0–44)
AST: 21 U/L (ref 15–41)
Albumin: 4.3 g/dL (ref 3.5–5.0)
Alkaline Phosphatase: 47 U/L (ref 38–126)
Anion gap: 6 (ref 5–15)
BUN: 5 mg/dL — ABNORMAL LOW (ref 6–20)
CO2: 27 mmol/L (ref 22–32)
Calcium: 9.5 mg/dL (ref 8.9–10.3)
Chloride: 102 mmol/L (ref 98–111)
Creatinine, Ser: 0.67 mg/dL (ref 0.44–1.00)
GFR, Estimated: >60 mL/min (ref >60)
Glucose, Bld: 139 mg/dL — ABNORMAL HIGH (ref 70–99)
Potassium: 3.3 mmol/L — ABNORMAL LOW (ref 3.5–5.1)
Sodium: 135 mmol/L (ref 135–145)
Total Bilirubin: 0.6 mg/dL (ref 0.3–1.2)
Total Protein: 8.2 g/dL — ABNORMAL HIGH (ref 6.5–8.1)

## 2021-09-26 LAB — CK: Total CK: 145 U/L (ref 38–234)

## 2021-09-26 LAB — LIPASE, BLOOD: Lipase: 26 U/L (ref 11–51)

## 2021-09-26 LAB — RESP PANEL BY RT-PCR (FLU A&B, COVID) ARPGX2
Influenza A by PCR: NEGATIVE
Influenza B by PCR: NEGATIVE
SARS Coronavirus 2 by RT PCR: POSITIVE — AB

## 2021-09-26 LAB — POCT URINE PREGNANCY: Preg Test, Ur: NEGATIVE

## 2021-09-26 MED ORDER — SODIUM CHLORIDE 0.9 % IV BOLUS
1000.0000 mL | Freq: Once | INTRAVENOUS | Status: AC
  Administered 2021-09-26: 1000 mL via INTRAVENOUS

## 2021-09-26 MED ORDER — ONDANSETRON HCL 4 MG/2ML IJ SOLN
4.0000 mg | Freq: Once | INTRAMUSCULAR | Status: AC
  Administered 2021-09-26: 4 mg via INTRAVENOUS
  Filled 2021-09-26: qty 2

## 2021-09-26 NOTE — ED Notes (Signed)
Pt ambulatory to restroom with no issues. Gait steady.

## 2021-09-26 NOTE — ED Provider Notes (Signed)
[facility]    CSN: [id number] Arrival date & time: 09/26/21  1143    HISTORY  No chief complaint on file.  HPI Tamara Rios is a 25 y.o. female. Pt reports of having abd pain that began this week (cramping), she states she has been having soft stool and feeling lightheaded.  Pt states the pain is in her lower abds, uterus area.  States her dad sent her here for testing for toxic metal poisoning.  Patient states she works out, goes to intense [employer] type workouts.  Patient states she just began to feel her thighs again a few days ago.  Patient states she does drink water but no more than 2-3 bottles a day.  Patient states she is made adjustments to her diet, cut out processed foods, limited sugary foods and added sugar.  Dip elevated ketones.  The history is provided by the patient.  History reviewed. No pertinent past medical history. There are no problems to display for this patient.  Abdominal ultrasound was performed on September 22, 2021, there were no acute findings, reproductive organs were not visualized or commented on.  Past Surgical History:  Procedure Laterality Date   TONSILLECTOMY     OB History   No obstetric history on file.    Home Medications    Prior to Admission medications   Medication Sig Start Date End Date Taking? Authorizing Provider  fluticasone (FLONASE) 50 MCG/ACT nasal spray Place 2 sprays into both nostrils daily. 08/21/21 09/20/21  Theadora Rama Scales, PA-C  ipratropium (ATROVENT) 0.03 % nasal spray Place 2 sprays into both nostrils every 12 (twelve) hours. 08/21/21   Theadora Rama Scales, PA-C  lansoprazole (PREVACID) 30 MG capsule Take 1 capsule (30 mg total) by mouth daily with breakfast. 08/21/21 11/19/21  Theadora Rama Scales, PA-C    Family History Family History  Problem Relation Age of Onset   Healthy Mother    Hypertension Father    Social History Social History   Tobacco Use   Smoking status: Never    Smokeless tobacco: Never  Vaping Use   Vaping Use: Never used  Substance Use Topics   Alcohol use: Yes   Drug use: Not Currently    Types: Marijuana    Comment: Patient states she discontinued marijuana use 3 years ago   Allergies   Peanut-containing drug products  Review of Systems Review of Systems Pertinent findings noted in history of present illness.   Physical Exam Triage Vital Signs ED Triage Vitals  Enc Vitals Group     BP 08/08/21 0827 (!) 147/82     Pulse Rate 08/08/21 0827 72     Resp 08/08/21 0827 18     Temp 08/08/21 0827 98.3 F (36.8 C)     Temp Source 08/08/21 0827 Oral     SpO2 08/08/21 0827 98 %     Weight --      Height --      Head Circumference --      Peak Flow --      Pain Score 08/08/21 0826 5     Pain Loc --      Pain Edu? --      Excl. in GC? --   No data found.  Updated Vital Signs BP 134/84 (BP Location: Right Arm)    Pulse 91    Temp 97.6 F (36.4 C) (Axillary)    Resp 18    LMP 08/30/2021    SpO2 99%   Physical Exam  Vitals and nursing note reviewed.  Constitutional:      General: She is not in acute distress.    Appearance: Normal appearance. She is not ill-appearing.  HENT:     Head: Normocephalic and atraumatic.  Eyes:     General: Lids are normal.        Right eye: No discharge.        Left eye: No discharge.     Extraocular Movements: Extraocular movements intact.     Conjunctiva/sclera: Conjunctivae normal.     Right eye: Right conjunctiva is not injected.     Left eye: Left conjunctiva is not injected.  Neck:     Trachea: Trachea and phonation normal.  Cardiovascular:     Rate and Rhythm: Normal rate and regular rhythm.     Pulses: Normal pulses.     Heart sounds: Normal heart sounds. No murmur heard.   No friction rub. No gallop.  Pulmonary:     Effort: Pulmonary effort is normal. No accessory muscle usage, prolonged expiration or respiratory distress.     Breath sounds: Normal breath sounds. No stridor, decreased  air movement or transmitted upper airway sounds. No decreased breath sounds, wheezing, rhonchi or rales.  Chest:     Chest wall: No tenderness.  Abdominal:     General: Abdomen is flat. Bowel sounds are normal. There is no distension.     Palpations: Abdomen is soft.     Tenderness: There is abdominal tenderness in the suprapubic area. There is no right CVA tenderness or left CVA tenderness.     Hernia: No hernia is present.  Musculoskeletal:        General: Normal range of motion.     Cervical back: Normal range of motion and neck supple. Normal range of motion.  Lymphadenopathy:     Cervical: No cervical adenopathy.  Skin:    General: Skin is warm and dry.     Findings: No erythema or rash.  Neurological:     General: No focal deficit present.     Mental Status: She is alert and oriented to person, place, and time.  Psychiatric:        Mood and Affect: Mood normal.        Behavior: Behavior normal.    Visual Acuity Right Eye Distance:   Left Eye Distance:   Bilateral Distance:    Right Eye Near:   Left Eye Near:    Bilateral Near:     UC Couse / Diagnostics / Procedures:    EKG  Radiology No results found.  Procedures Procedures (including critical care time)  UC Diagnoses / Final Clinical Impressions(s)   I have reviewed the triage vital signs and the nursing notes.  Pertinent labs & imaging results that were available during my care of the patient were reviewed by me and considered in my medical decision making (see chart for details).    Final diagnoses:  Abdominal pain, unspecified abdominal location  Nausea   Patient has vague complaints of intermittent nausea, suprapubic pain.  Is my opinion that the last test she certainly needs performed would be transvaginal ultrasound to rule out any reproductive issues.  At this time, I have strongly encouraged her to be conscientious about rehydrating and post hydrating when doing intensive workouts.  Patient advised  to have no other recommendations at this time.  We will proceed with STD screening, urinary tract infection testing, patient will be treated as appropriate based on those results.  ED Prescriptions  None    PDMP not reviewed this encounter.  Pending results:  Labs Reviewed  POCT URINALYSIS DIP (MANUAL ENTRY) - Abnormal; Notable for the following components:      Result Value   Ketones, POC UA >= (160) (*)    Protein Ur, POC trace (*)    All other components within normal limits  POCT URINE PREGNANCY  CERVICOVAGINAL ANCILLARY ONLY    Medications Ordered in UC: Medications - No data to display  Disposition Upon Discharge:  Condition: stable for discharge home Home: take medications as prescribed; routine discharge instructions as discussed; follow up as advised.  Patient presented with an acute illness with associated systemic symptoms and significant discomfort requiring urgent management. In my opinion, this is a condition that a prudent lay person (someone who possesses an average knowledge of health and medicine) may potentially expect to result in complications if not addressed urgently such as respiratory distress, impairment of bodily function or dysfunction of bodily organs.   Routine symptom specific, illness specific and/or disease specific instructions were discussed with the patient and/or caregiver at length.   As such, the patient has been evaluated and assessed, work-up was performed and treatment was provided in alignment with urgent care protocols and evidence based medicine.  Patient/parent/caregiver has been advised that the patient may require follow up for further testing and treatment if the symptoms continue in spite of treatment, as clinically indicated and appropriate.  If the patient was tested for COVID-19, Influenza and/or RSV, then the patient/parent/guardian was advised to isolate at home pending the results of his/her diagnostic coronavirus test and  potentially longer if theyre positive. I have also advised pt that if his/her COVID-19 test returns positive, it's recommended to self-isolate for at least 10 days after symptoms first appeared AND until fever-free for 24 hours without fever reducer AND other symptoms have improved or resolved. Discussed self-isolation recommendations as well as instructions for household member/close contacts as per the [facility] and [place] DHHS, and also gave patient the COVID packet with this information.  Patient/parent/caregiver has been advised to return to the [facility] or PCP in 3-5 days if no better; to PCP or the Emergency Department if new signs and symptoms develop, or if the current signs or symptoms continue to change or worsen for further workup, evaluation and treatment as clinically indicated and appropriate  The patient will follow up with their current PCP if and as advised. If the patient does not currently have a PCP we will assist them in obtaining one.   The patient may need specialty follow up if the symptoms continue, in spite of conservative treatment and management, for further workup, evaluation, consultation and treatment as clinically indicated and appropriate.   Patient/parent/caregiver verbalized understanding and agreement of plan as discussed.  All questions were addressed during visit.  Please see discharge instructions below for further details of plan.  Discharge Instructions: Discharge Instructions   None     This office note has been dictated using Dragon speech recognition software.  Unfortunately, and despite my best efforts, this method of dictation can sometimes lead to occasional typographical or grammatical errors.  I apologize in advance if this occurs.     Theadora Rama Scales, PA-C 09/26/21 [phone]

## 2021-09-26 NOTE — ED Triage Notes (Signed)
Pt reports of having abd pain that began this week (cramping), she states she has been having soft stool and feeling lightheaded.  Pt states the pain is in her lower abds, uterus area.

## 2021-09-26 NOTE — ED Provider Notes (Signed)
[facility] EMERGENCY DEPARTMENT Provider Note   CSN: [id number] Arrival date & time: 09/26/21  1657     History Chief Complaint  Patient presents with   Abdominal Pain    Tamara Rios is a 25 y.o. female.  The history is provided by the patient.  Abdominal Pain Pain location:  Generalized Pain quality: aching   Pain radiates to:  Does not radiate Pain severity:  Mild Onset quality:  Gradual Timing:  Intermittent Progression:  Waxing and waning Chronicity:  New Relieved by:  Nothing Ineffective treatments:  None tried Associated symptoms: diarrhea, nausea and vomiting   Associated symptoms: no anorexia, no belching, no chest pain, no chills, no cough, no dysuria, no fatigue, no fever, no flatus, no hematemesis, no hematochezia, no hematuria, no melena, no shortness of breath, no sore throat, no vaginal bleeding and no vaginal discharge   Risk factors: not pregnant       History reviewed. No pertinent past medical history.  There are no problems to display for this patient.   Past Surgical History:  Procedure Laterality Date   TONSILLECTOMY       OB History   No obstetric history on file.     Family History  Problem Relation Age of Onset   Healthy Mother    Hypertension Father     Social History   Tobacco Use   Smoking status: Never   Smokeless tobacco: Never  Vaping Use   Vaping Use: Never used  Substance Use Topics   Alcohol use: Not Currently   Drug use: Not Currently    Types: Marijuana    Comment: Patient states she discontinued marijuana use 3 years ago    Home Medications Prior to Admission medications   Not on File    Allergies    Peanut-containing drug products  Review of Systems   Review of Systems  Constitutional:  Negative for chills, fatigue and fever.  HENT:  Negative for ear pain and sore throat.   Eyes:  Negative for pain and visual disturbance.  Respiratory:  Negative for cough and shortness of  breath.   Cardiovascular:  Negative for chest pain and palpitations.  Gastrointestinal:  Positive for abdominal pain, diarrhea, nausea and vomiting. Negative for anorexia, flatus, hematemesis, hematochezia and melena.  Genitourinary:  Negative for dysuria, hematuria, vaginal bleeding and vaginal discharge.  Musculoskeletal:  Positive for myalgias. Negative for arthralgias and back pain.  Skin:  Negative for color change and rash.  Neurological:  Negative for seizures and syncope.  All other systems reviewed and are negative.  Physical Exam Updated Vital Signs BP 132/79    Pulse 90    Temp 98.4 F (36.9 C) (Oral)    Resp 17    Ht 5\' 6"  (1.676 m)    Wt 83.9 kg    LMP 09/09/2021    SpO2 100%    BMI 29.86 kg/m   Physical Exam Vitals and nursing note reviewed.  Constitutional:      General: She is not in acute distress.    Appearance: She is well-developed. She is not ill-appearing.  HENT:     Head: Normocephalic and atraumatic.     Mouth/Throat:     Mouth: Mucous membranes are moist.  Eyes:     Extraocular Movements: Extraocular movements intact.     Conjunctiva/sclera: Conjunctivae normal.     Pupils: Pupils are equal, round, and reactive to light.  Cardiovascular:     Rate and Rhythm: Normal rate and regular  rhythm.     Heart sounds: Normal heart sounds. No murmur heard. Pulmonary:     Effort: Pulmonary effort is normal. No respiratory distress.     Breath sounds: Normal breath sounds.  Abdominal:     General: Abdomen is flat. There is no distension.     Palpations: Abdomen is soft.     Tenderness: There is no abdominal tenderness. There is no guarding or rebound.     Hernia: No hernia is present.  Musculoskeletal:        General: No swelling.     Cervical back: Neck supple.  Skin:    General: Skin is warm and dry.     Capillary Refill: Capillary refill takes less than 2 seconds.  Neurological:     Mental Status: She is alert.  Psychiatric:        Mood and Affect: Mood  normal.    ED Results / Procedures / Treatments   Labs (all labs ordered are listed, but only abnormal results are displayed) Labs Reviewed  CBC WITH DIFFERENTIAL/PLATELET - Abnormal; Notable for the following components:      Result Value   WBC 12.2 (*)    Hemoglobin 11.5 (*)    HCT 35.1 (*)    Neutro Abs 8.0 (*)    All other components within normal limits  COMPREHENSIVE METABOLIC PANEL - Abnormal; Notable for the following components:   Potassium 3.3 (*)    Glucose, Bld 139 (*)    BUN 5 (*)    Total Protein 8.2 (*)    All other components within normal limits  RESP PANEL BY RT-PCR (FLU A&B, COVID) ARPGX2  LIPASE, BLOOD  CK    EKG None  Radiology No results found.  Procedures Procedures   Medications Ordered in ED Medications  sodium chloride 0.9 % bolus 1,000 mL (1,000 mLs Intravenous New Bag/Given 09/26/21 1733)  ondansetron (ZOFRAN) injection 4 mg (4 mg Intravenous Given 09/26/21 1736)    ED Course  I have reviewed the triage vital signs and the nursing notes.  Pertinent labs & imaging results that were available during my care of the patient were reviewed by me and considered in my medical decision making (see chart for details).    MDM Rules/Calculators/A&P                          Tamara Rios is here with concern for dehydration, abdominal pain, nausea, vomiting, diarrhea.  Has had some body aches and muscle aches especially in her lower legs.  Thinks that she has had some vigorous workouts recently and she is having some lower leg cramping and pain that is now improved.  She had a negative pregnancy test and negative urinalysis at urgent care and was sent for further evaluation today.  She states that she is mostly concerned about being acidotic.  She has no abdominal pain currently.  No tenderness on exam.  She is not having any vaginal bleeding or vaginal discharge.  Overall suspect that this could be viral process.  She does not look clinically  dehydrated on exam.  Lab work will be obtained.  Vital signs are normal.  No fever.  We will check a CK to evaluate for rhabdomyolysis.  We will check kidney function and liver function.  Have low concern for appendicitis or other intra-abdominal process at this time.  Lab work is overall unremarkable.  No concern for dehydration or rhabdomylosis. CK normal. No abdominal pain or  re-eval.  Given reassurance and discharged in ED in good condition.  This chart was dictated using voice recognition software.  Despite best efforts to proofread,  errors can occur which can change the documentation meaning.       Final Clinical Impression(s) / ED Diagnoses Final diagnoses:  Abdominal pain, unspecified abdominal location    Rx / DC Orders ED Discharge Orders     None        Lennice Sites, DO 09/26/21 1820

## 2021-09-26 NOTE — ED Triage Notes (Signed)
Pt c/o abd pain, nausea and diarrhea x ~18months-states she has been seen 3-4 times for same at [facility] today at [facility] she came to ED on her own-was not advised by UC to come to ED-NAD-steady gait

## 2021-09-29 ENCOUNTER — Telehealth: Payer: Self-pay | Admitting: Emergency Medicine

## 2021-09-29 LAB — CERVICOVAGINAL ANCILLARY ONLY
Bacterial Vaginitis (gardnerella): POSITIVE — AB
Candida Glabrata: NEGATIVE
Candida Vaginitis: NEGATIVE
Chlamydia: NEGATIVE
Comment: NEGATIVE
Comment: NEGATIVE
Comment: NEGATIVE
Comment: NEGATIVE
Comment: NEGATIVE
Comment: NORMAL
Neisseria Gonorrhea: NEGATIVE
Trichomonas: NEGATIVE

## 2021-09-29 MED ORDER — METRONIDAZOLE 500 MG PO TABS: 500.0000 mg | ORAL_TABLET | Freq: Two times a day (BID) | ORAL | 0 refills | Status: DC

## 2021-11-26 ENCOUNTER — Emergency Department (HOSPITAL_BASED_OUTPATIENT_CLINIC_OR_DEPARTMENT_OTHER)
Admission: EM | Admit: 2021-11-26 | Discharge: 2021-11-26 | Disposition: A | Discharge status: Home / Self Care | Payer: Federal, State, Local not specified - PPO | Attending: Emergency Medicine | Admitting: Emergency Medicine

## 2021-11-26 ENCOUNTER — Encounter (HOSPITAL_BASED_OUTPATIENT_CLINIC_OR_DEPARTMENT_OTHER): Payer: Self-pay

## 2021-11-26 ENCOUNTER — Other Ambulatory Visit: Payer: Self-pay

## 2021-11-26 DIAGNOSIS — T7840XA Allergy, unspecified, initial encounter: Secondary | ICD-10-CM | POA: Diagnosis present

## 2021-11-26 MED ORDER — PREDNISONE 20 MG PO TABS: ORAL_TABLET | ORAL | 0 refills | Status: DC

## 2021-11-26 NOTE — ED Triage Notes (Signed)
Pt c/o tongue irritation/pain/swelling that started after eating tacos ~15 min PTA-pt states she took benadryl 50mg  PTA-c/o cont's tongue pain-NAD-steady gait

## 2021-11-26 NOTE — ED Notes (Signed)
Rx x 1 given Written and verbal inst to pt  Verbalized an understanding   To home with friend   

## 2021-11-26 NOTE — Discharge Instructions (Signed)
Continue to take Benadryl 50 mg every 6 hours as needed for tongue swelling  If that does not improve, please take prednisone as prescribed.  Please avoid taco meat with lying for now  See your doctor for follow-up  Return to ER if you have worse tongue swelling, trouble breathing, rash

## 2021-11-26 NOTE — ED Notes (Signed)
ED Provider at bedside. 

## 2021-11-26 NOTE — ED Provider Notes (Signed)
[facility] EMERGENCY DEPARTMENT Provider Note   CSN: [id number] Arrival date & time: 11/26/21  1912     History  Chief Complaint  Patient presents with   Allergic Reaction    Tamara Rios is a 26 y.o. female here presenting with tongue swelling.  Patient ate taco with lime about 15 minutes prior to arrival.  Patient immediately had tongue swelling.  Patient took 50 mg of Benadryl prior to arrival and the tongue swelling has improved.  Denies any rash or trouble breathing or throat closing  The history is provided by the patient.      Home Medications Prior to Admission medications   Medication Sig Start Date End Date Taking? Authorizing Provider  metroNIDAZOLE (FLAGYL) 500 MG tablet Take 1 tablet (500 mg total) by mouth 2 (two) times daily. 09/29/21   LampteyBritta Mccreedy, MD      Allergies    Peanut-containing drug products    Review of Systems   Review of Systems  HENT:         Tongue swelling  All other systems reviewed and are negative.  Physical Exam Updated Vital Signs BP (!) 128/92 (BP Location: Left Arm)    Pulse 76    Temp 98.7 F (37.1 C) (Oral)    Resp 18    Ht 5\' 6"  (1.676 m)    Wt 83.5 kg    LMP 11/09/2021    SpO2 100%    BMI 29.70 kg/m  Physical Exam Vitals and nursing note reviewed.  HENT:     Nose: Nose normal.     Mouth/Throat:     Mouth: Mucous membranes are moist.     Comments: No obvious tongue swelling or lip swelling right now. Eyes:     Extraocular Movements: Extraocular movements intact.     Pupils: Pupils are equal, round, and reactive to light.  Neck:     Comments: No stridor Cardiovascular:     Rate and Rhythm: Normal rate and regular rhythm.     Pulses: Normal pulses.     Heart sounds: Normal heart sounds.  Pulmonary:     Effort: Pulmonary effort is normal.     Breath sounds: Normal breath sounds.  Abdominal:     General: Abdomen is flat.     Palpations: Abdomen is soft.  Musculoskeletal:        General: Normal  range of motion.     Cervical back: Normal range of motion.  Skin:    General: Skin is warm.     Capillary Refill: Capillary refill takes less than 2 seconds.     Comments: No obvious urticaria  Neurological:     General: No focal deficit present.     Mental Status: She is alert. Mental status is at baseline.  Psychiatric:        Mood and Affect: Mood normal.        Behavior: Behavior normal.    ED Results / Procedures / Treatments   Labs (all labs ordered are listed, but only abnormal results are displayed) Labs Reviewed - No data to display  EKG None  Radiology No results found.  Procedures Procedures    Medications Ordered in ED Medications - No data to display  ED Course/ Medical Decision Making/ A&P                           Medical Decision Making Tamara Rios is a 26 y.o. female here with  possible allergic reaction.  Patient ate some taco that has lime in it and the daily had a reaction.  Unclear if it is just a local reaction versus mild allergic reaction.  Patient's tongue is not swollen after taking Benadryl.  I told her to avoid lime and taco for now.  He can continue to take Benadryl.  She can also take prednisone if the swelling returns despite taking Benadryl   Risk OTC drugs. Prescription drug management.   Final Clinical Impression(s) / ED Diagnoses Final diagnoses:  None    Rx / DC Orders ED Discharge Orders     None         Charlynne Pander, MD 11/26/21 1950

## 2021-12-19 ENCOUNTER — Ambulatory Visit (INDEPENDENT_AMBULATORY_CARE_PROVIDER_SITE_OTHER): Payer: Federal, State, Local not specified - PPO | Admitting: Internal Medicine

## 2021-12-19 ENCOUNTER — Other Ambulatory Visit: Payer: Self-pay

## 2021-12-19 ENCOUNTER — Encounter: Payer: Self-pay | Admitting: Internal Medicine

## 2021-12-19 VITALS — BP 120/76 | HR 80 | Temp 97.6°F | Ht 67.0 in | Wt 180.1 lb

## 2021-12-19 DIAGNOSIS — T7800XA Anaphylactic reaction due to unspecified food, initial encounter: Secondary | ICD-10-CM

## 2021-12-19 DIAGNOSIS — H1013 Acute atopic conjunctivitis, bilateral: Secondary | ICD-10-CM | POA: Diagnosis not present

## 2021-12-19 DIAGNOSIS — T781XXA Other adverse food reactions, not elsewhere classified, initial encounter: Secondary | ICD-10-CM

## 2021-12-19 DIAGNOSIS — J3089 Other allergic rhinitis: Secondary | ICD-10-CM | POA: Diagnosis not present

## 2021-12-19 DIAGNOSIS — J310 Chronic rhinitis: Secondary | ICD-10-CM

## 2021-12-19 DIAGNOSIS — J302 Other seasonal allergic rhinitis: Secondary | ICD-10-CM

## 2021-12-19 MED ORDER — EPINEPHRINE 0.3 MG/0.3ML IJ SOAJ: 0.3000 mg | INTRAMUSCULAR | 2 refills | Status: AC | PRN

## 2021-12-19 NOTE — Patient Instructions (Addendum)
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 ?- If using Epinephrine autoinjector, call 911 ?- A food allergy action plan has been provided and discussed. ?- Medic Alert identification is recommended. ? ?Chronic Rhinitis -seasonal and perennial allergic rhinitis: ?- allergy testing today was positive to weeds, trees, grasses, dust mite ?- allergen avoidance as below ?- consider allergy shots as long term control of your symptoms by teaching your immune system to be more tolerant of your allergy triggers ?- Consider Nasal Steroid Spray: Options include Flonase (fluticasone), Nasocort (triamcinolone), Nasonex (mometasome) 1- 2 sprays in each nostril daily (can buy over-the-counter if not covered by insurance)  Best results if used daily. ?- Start over the counter antihistamine daily or daily as needed.   ?-Your options include Zyrtec (Cetirizine) 10mg , Claritin (Loratadine) 10mg , Allegra (Fexofenadine) 180mg , or Xyzal (Levocetirinze) 5mg  ? ?Allergic Conjunctivitis:  ?- Consider Allergy Eye drops: great options include Pataday (Olopatadine) or Zaditor (ketotifen) for eye symptoms daily as needed-both sold over the counter if not covered by insurance.   ?-Avoid eye drops that say red eye relief as they may contain medications that dry out your eyes. ? ?Follow-up in 3 months with a provider, sooner if needed ?Call sooner to schedule your first allergy injection-be sure to verify if you want to do RUSH when you make your appointment. ? ?It was a pleasure seeing you in clinic today! ? ?

## 2021-12-19 NOTE — Progress Notes (Signed)
NEW PATIENT Date of Service/Encounter:  12/19/21 Referring provider: none-self referred Primary care provider: Pcp, No  Subjective:  Tamara Rios is a 26 y.o. female presenting today for evaluation of "allergies" History obtained from: chart review and patient.   Concern for Food Allergy:  Food of concern: peanuts History of reaction:  3 years ago, she ate pad thai - eyes itchy, lip swollen, head itchy, hives on arms/stomach, nose closed Following this she had peanuts-stomach pain, itchy throat Then had a reese's-stomach hurting Kiwi-throat feels weird Sometimes certain foods make the sides of her tongue hurt-she thinks lime Walnuts and pecans cause her mouth to hurt-but tolerates almonds Previous allergy testing no Eats egg, dairy, wheat, soy, fish, shellfish, sesame seeds without reactions Carries an epinephrine autoinjector: yes; about to expire  Chronic rhinitis: started when she moved to [place] at age 68 yo Symptoms include: nasal congestion, sneezing, watery eyes, and itchy eyes  Occurs seasonally-Spring Potential triggers: outdoor pollen Treatments tried: benadryl as needed-naywhere to 2 times a week or month Previous allergy testing: no History of reflux/heartburn:  yes-takes Tums as needed, but hasn't needed since changing her diet  Patient recently referred to GI on 12/11/2021 with primary care physician for constipation.  Other allergy screening: Asthma: no Medication allergy: no Hymenoptera allergy: no Urticaria: no Eczema:no-thinks she had it when she was little History of recurrent infections suggestive of immunodeficency: no Vaccinations are up to date.   Past Medical History: No past medical history on file. Medication List:  Current Outpatient Medications  Medication Sig Dispense Refill   EPINEPHrine 0.3 mg/0.3 mL IJ SOAJ injection Inject into the skin.     fluticasone (FLONASE) 50 MCG/ACT nasal spray Place into the nose. (Patient not taking:  Reported on 12/19/2021)     metroNIDAZOLE (FLAGYL) 500 MG tablet Take 1 tablet (500 mg total) by mouth 2 (two) times daily. 14 tablet 0   predniSONE (DELTASONE) 20 MG tablet Take 60 mg daily x 2 days then 40 mg daily x 2 days then 20 mg daily x 2 days (Patient not taking: Reported on 12/19/2021) 12 tablet 0   No current facility-administered medications for this visit.   Known Allergies:  Allergies  Allergen Reactions   Peanut-Containing Drug Products Anaphylaxis   Past Surgical History: Past Surgical History:  Procedure Laterality Date   TONSILLECTOMY     Family History: Family History  Problem Relation Age of Onset   Healthy Mother    Hypertension Father    Asthma Brother    Allergic rhinitis Brother    Social History: Chloris lives in an apartment without water damage, carpet, electric heating, central AC, no pets indoors, previous issue with cockroaches which have been treated, no smoke exposure.  She is in college.  [place]   ROS:  All other systems negative except as noted per HPI.  Objective:  Blood pressure 120/76, pulse 80, temperature 97.6 F (36.4 C), temperature source Temporal, height 5\' 7"  (1.702 m), weight 180 lb 1.6 oz (81.7 kg), SpO2 100 %. Body mass index is 28.21 kg/m. Physical Exam:  General Appearance:  Alert, cooperative, no distress, appears stated age  Head:  Normocephalic, without obvious abnormality, atraumatic  Eyes:  Conjunctiva clear, EOM's intact  Nose: Nares normal, hypertrophic turbinates, normal mucosa, no visible anterior polyps, and septum midline  Throat: Lips, tongue normal; teeth and gums normal, normal posterior oropharynx  Neck: Supple, symmetrical  Lungs:   clear to auscultation bilaterally, Respirations unlabored, no coughing  Heart:  regular rate and rhythm and no murmur, Appears well perfused  Extremities: No edema  Skin: Skin color, texture, turgor normal, no rashes or lesions on visualized  portions of skin  Neurologic: No gross deficits     Diagnostics: Skin Testing: Environmental allergy panel and select foods.  Adequate controls. Results discussed with patient/family.  Airborne Adult Perc - 12/19/21 1038     Time Antigen Placed 1038    Allergen Manufacturer Greer    Location Back    Number of Test 59    Panel 1 Select    1. Control-Buffer 50% Glycerol Negative    2. Control-Histamine 1 mg/ml 3+    3. Albumin saline Negative    4. Bahia 4+    5. [place] 4+    6. Johnson 4+    7. [place] Blue 4+    8. Meadow Fescue Negative    9. Perennial Rye 4+    10. Sweet Vernal 4+    11. Timothy 4+    12. Cocklebur Negative    13. Burweed Marshelder 3+    14. Ragweed, short 4+    15. Ragweed, Giant 3+    16. Plantain,  English Negative    17. Lamb's Quarters 3+    18. Sheep Sorrell 3+    19. Rough Pigweed 3+    20. Marsh Elder, Rough 3+    21. Mugwort, Common 3+    22. Ash mix 4+    23. Birch mix 3+    24. Beech American 3+    25. Box, Elder Negative    26. Cedar, red 4+    27. Cottonwood, Guinea-Bissau Negative    28. Elm mix 3+    29. Hickory 4+    30. Maple mix 3+    31. Oak, Guinea-[place] mix 3+    32. Pecan Pollen 4+    33. Pine mix Negative    34. Sycamore Eastern Negative    35. Walnut, Black Pollen 4+    36. Alternaria alternata Negative    37. Cladosporium Herbarum Negative    38. Aspergillus mix Negative    39. Penicillium mix Negative    40. Bipolaris sorokiniana (Helminthosporium) Negative    41. Drechslera spicifera (Curvularia) Negative    42. Mucor plumbeus Negative    43. Fusarium moniliforme Negative    44. Aureobasidium pullulans (pullulara) Negative    45. Rhizopus oryzae Negative    46. Botrytis cinera Negative    47. Epicoccum nigrum Negative    48. Phoma betae Negative    49. Candida Albicans Negative    50. Trichophyton mentagrophytes Negative    51. Mite, D Farinae  5,000 AU/ml 4+    52. Mite, D Pteronyssinus  5,000 AU/ml 4+    53.  Cat Hair 10,000 BAU/ml Negative    54.  Dog Epithelia Negative    55. Mixed Feathers Negative    56. Horse Epithelia Negative    57. Cockroach, German Negative    58. Mouse 3+    59. Tobacco Leaf 3+             Food Adult Perc - 12/19/21 1000     Time Antigen Placed 1038    Allergen Manufacturer Waynette Buttery    Location Back    Number of allergen test 15    1. Peanut --   9*30   10. Cashew --   5*8   11. Pecan Food --   3*3   12. [employer] --  3*3   13. Almond --   3*3   14. Hazelnut --   6*10   15. [place] nut --   3*4   16. Coconut Negative    17. Pistachio Negative    37. Pork Negative    54. Cucumber --   5*5   60. Strawberry Negative    65. Karaya Gum Negative    66. Acacia (Arabic Gum) Negative    67. Cinnamon Negative             Allergy testing results were read and interpreted by myself, documented by clinical staff.  Assessment and Plan  Henderson NewcomerMichaela has a history concerning for peanut and tree nut allergy which was confirmed with today's testing.  She also has a history of chronic rhinitis and conjunctivitis determined to be both seasonal and perennial allergic based on today's testing.  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  Food allergy:  - today's skin testing was positive to peanuts, treenuts, cucumber - please strictly avoid peanuts, tree nuts (except almond since you are eating and tolerating these), and cucumber - okay to resume eating Almonds - for SKIN only reaction, okay to take Benadryl 2 capsules every 4 hours - for SKIN + ANY additional symptoms, OR IF concern for LIFE THREATENING reaction = Epipen Autoinjector EpiPen 0.3 mg. - If using Epinephrine autoinjector, call 911 - A food allergy action plan has been provided  and discussed. - Medic Alert identification is recommended.  Chronic Rhinitis -seasonal and perennial allergic rhinitis: - allergy testing today was positive to weeds, trees, grasses, dust mite - allergen avoidance as below - consider allergy shots as long term control of your symptoms by teaching your immune system to be more tolerant of your allergy triggers - Consider Nasal Steroid Spray: Options include Flonase (fluticasone), Nasocort (triamcinolone), Nasonex (mometasome) 1- 2 sprays in each nostril daily (can buy over-the-counter if not covered by insurance)  Best results if used daily. - Start over the counter antihistamine daily or daily as needed.   -Your options include Zyrtec (Cetirizine) 10mg , Claritin (Loratadine) 10mg , Allegra (Fexofenadine) 180mg , or Xyzal (Levocetirinze) 5mg   Allergic Conjunctivitis:  - Consider Allergy Eye drops: great options include Pataday (Olopatadine) or Zaditor (ketotifen) for eye symptoms daily as needed-both sold over the counter if not covered by insurance.   -Avoid eye drops that say red eye relief as they may contain medications that dry out your eyes.  Follow-up in 3 months with a provider, sooner if needed Call sooner to schedule your first allergy injection-be sure to verify if you want to do RUSH when you make your appointment.  It was a pleasure seeing you in clinic today!    Sincerely,  Tonny BollmanErin Alabama Doig, MD [facility]

## 2022-01-20 LAB — POCT URINALYSIS DIPSTICK
Bilirubin, UA: NEGATIVE
Blood, UA: NEGATIVE
Glucose, UA: NEGATIVE
Ketones, UA: NEGATIVE
Nitrite, UA: NEGATIVE
Protein, UA: NEGATIVE
Spec Grav, UA: 1.02 (ref 1.010–1.025)
Urobilinogen, UA: 0.2 E.U./dL
pH, UA: 6.5 (ref 5.0–8.0)

## 2022-03-23 ENCOUNTER — Ambulatory Visit: Payer: Federal, State, Local not specified - PPO | Admitting: Internal Medicine

## 2022-05-14 ENCOUNTER — Encounter: Payer: Self-pay | Admitting: General Practice

## 2022-05-17 NOTE — Progress Notes (Deleted)
FOLLOW UP Date of Service/Encounter:  05/17/22   Subjective:  Tamara Rios (DOB: May 28, 1996) is a 26 y.o. female who [facility] in re-evaluation of the following: allergic rhinitis, food allergy History obtained from: chart review and {Persons; PED relatives w/patient:19415::"patient"}.  For Review, LV was on 12/19/21  with Dr.Armine Rizzolo seen for intial visit for allergic rhinitis and concern for food allergy .  Pertinent History/Diagnostics:  - Allergic Rhinitis: started when she moved to [place] at age 15 yo  - SPT environmental panel (12/19/21): positive to weeds, trees, grasses, dust mite - Food Allergy (peanuts, tree nuts)  - Hx of reaction: Peanuts: 3 years ago, she ate pad thai - eyes itchy, lip swollen, head itchy, hives on arms/stomach, nose closed/ Following this she had peanuts-stomach pain, itchy throat. Then had a reese's-stomach pain. Walnuts and pecans cause her mouth to hurt-but tolerates almonds  - SPT select foods (12/19/21): + peanut (9x30),cashew(5x8),pecan (3x3),walnut (3x3), almond (3x3), hazelnut (6x10), [place] nut (3x$), negative pistachio, cucumber (5x5), negative strawberry, cinnamon, coconut, pork   Today presents for follow-up. ***  Allergies as of 05/18/2022       Reactions   Peanut-containing Drug Products Anaphylaxis   Cucumber Extract    Other    tREE NUTS-        Medication List        Accurate as of May 17, 2022  5:53 PM. If you have any questions, ask your nurse or doctor.          EPINEPHrine 0.3 mg/0.3 mL Soaj injection Commonly known as: EPI-PEN Inject 0.3 mg into the muscle as needed for anaphylaxis.   fluticasone 50 MCG/ACT nasal spray Commonly known as: FLONASE Place into the nose.   metroNIDAZOLE 500 MG tablet Commonly known as: FLAGYL Take 1 tablet (500 mg total) by mouth 2 (two) times daily.   predniSONE 20 MG tablet Commonly known as: DELTASONE Take 60 mg daily x 2 days  then 40 mg daily x 2 days then 20 mg daily x 2 days       No past medical history on file. Past Surgical History:  Procedure Laterality Date   TONSILLECTOMY     Otherwise, there have been no changes to her past medical history, surgical history, family history, or social history.  ROS: All others negative except as noted per HPI.   Objective:  There were no vitals taken for this visit. There is no height or weight on file to calculate BMI. Physical Exam: General Appearance:  Alert, cooperative, no distress, appears stated age  Head:  Normocephalic, without obvious abnormality, atraumatic  Eyes:  Conjunctiva clear, EOM's intact  Nose: Nares normal, {Blank multiple:19196:a:"***","hypertrophic turbinates","normal mucosa","no visible anterior polyps","septum midline"}  Throat: Lips, tongue normal; teeth and gums normal, {Blank multiple:19196:a:"***","normal posterior oropharynx","tonsils 2+","tonsils 3+","no tonsillar exudate","+ cobblestoning"}  Neck: Supple, symmetrical  Lungs:   {Blank multiple:19196:a:"***","clear to auscultation bilaterally","end-expiratory wheezing","wheezing throughout"}, Respirations unlabored, {Blank multiple:19196:a:"***","no coughing","intermittent dry coughing"}  Heart:  {Blank multiple:19196:a:"***","regular rate and rhythm","no murmur"}, Appears well perfused  Extremities: No edema  Skin: Skin color, texture, turgor normal, no rashes or lesions on visualized portions of skin  Neurologic: No gross deficits   Reviewed: ***  Spirometry:  Tracings reviewed. Her effort: {Blank single:19197::"Good reproducible efforts.","It was hard to get consistent efforts and there is a question as to whether this reflects a maximal maneuver.","Poor effort, data can not be interpreted.","Variable effort-results affected.","decent for first attempt at spirometry."} FVC: ***L FEV1: ***L, ***%  predicted FEV1/FVC ratio: ***% Interpretation: {Blank single:19197::"Spirometry  consistent with mild obstructive disease","Spirometry consistent with moderate obstructive disease","Spirometry consistent with severe obstructive disease","Spirometry consistent with possible restrictive disease","Spirometry consistent with mixed obstructive and restrictive disease","Spirometry uninterpretable due to technique","Spirometry consistent with normal pattern","No overt abnormalities noted given today's efforts"}.  Please see scanned spirometry results for details.  Skin Testing: {Blank single:19197::"Select foods","Environmental allergy panel","Environmental allergy panel and select foods","Food allergy panel","None","Deferred due to recent antihistamines use","deferred due to recent reaction"}. ***Adequate positive and negative controls Results discussed with patient/family.   {Blank single:19197::"Allergy testing results were read and interpreted by myself, documented by clinical staff."," "}  Assessment/Plan   ***  Tonny Bollman, MD  [facility]

## 2022-05-18 ENCOUNTER — Ambulatory Visit: Payer: Federal, State, Local not specified - PPO | Admitting: Internal Medicine

## 2022-05-18 DIAGNOSIS — J309 Allergic rhinitis, unspecified: Secondary | ICD-10-CM

## 2022-10-07 ENCOUNTER — Other Ambulatory Visit: Payer: Self-pay

## 2022-10-07 ENCOUNTER — Encounter (HOSPITAL_COMMUNITY): Payer: Self-pay | Admitting: *Deleted

## 2022-10-07 ENCOUNTER — Inpatient Hospital Stay (HOSPITAL_COMMUNITY)
Admission: AD | Admit: 2022-10-07 | Discharge: 2022-10-07 | Disposition: A | Discharge status: Home / Self Care | Payer: BC Managed Care – PPO | Attending: Obstetrics & Gynecology | Admitting: Obstetrics & Gynecology

## 2022-10-07 DIAGNOSIS — O368310 Maternal care for abnormalities of the fetal heart rate or rhythm, first trimester, not applicable or unspecified: Secondary | ICD-10-CM | POA: Diagnosis not present

## 2022-10-07 DIAGNOSIS — O26891 Other specified pregnancy related conditions, first trimester: Secondary | ICD-10-CM | POA: Insufficient documentation

## 2022-10-07 DIAGNOSIS — Z3A13 13 weeks gestation of pregnancy: Secondary | ICD-10-CM | POA: Insufficient documentation

## 2022-10-07 DIAGNOSIS — R1030 Lower abdominal pain, unspecified: Secondary | ICD-10-CM | POA: Diagnosis not present

## 2022-10-07 LAB — URINALYSIS, ROUTINE W REFLEX MICROSCOPIC
Bilirubin Urine: NEGATIVE
Glucose, UA: NEGATIVE mg/dL
Hgb urine dipstick: NEGATIVE
Ketones, ur: NEGATIVE mg/dL
Leukocytes,Ua: NEGATIVE
Nitrite: NEGATIVE
Protein, ur: NEGATIVE mg/dL
Specific Gravity, Urine: 1.015 (ref 1.005–1.030)
pH: 6 (ref 5.0–8.0)

## 2022-10-07 LAB — WET PREP, GENITAL
Clue Cells Wet Prep HPF POC: NONE SEEN
Sperm: NONE SEEN
Trich, Wet Prep: NONE SEEN
WBC, Wet Prep HPF POC: <10 (ref <10)
Yeast Wet Prep HPF POC: NONE SEEN

## 2022-10-07 LAB — POCT PREGNANCY, URINE: Preg Test, Ur: POSITIVE — AB

## 2022-10-07 NOTE — MAU Note (Signed)
Tamara Rios is a 26 y.o. at Unknown here in MAU reporting: lower abdominal pain that began 3 weeks ago along with a constant heaviness..  Also reports brown/pink discharge.   LMP:  07/18/22 Onset of complaint: 3 weeks ago Pain score: 7 Vitals:   10/07/22 1620  BP: (!) 140/82  Pulse: 88  Resp: 20  Temp: 98.3 F (36.8 C)  SpO2: 100%     FHT: attempted, none heard Lab orders placed from triage:   UPT & UA

## 2022-10-07 NOTE — Progress Notes (Signed)
Knox Saliva, CNM @ bedside performing U/S, cardiac activity noted.

## 2022-10-08 LAB — GC/CHLAMYDIA PROBE AMP (~~LOC~~) NOT AT ARMC
Chlamydia: NEGATIVE
Comment: NEGATIVE
Comment: NORMAL
Neisseria Gonorrhea: NEGATIVE

## 2022-10-11 NOTE — MAU Provider Note (Signed)
History     CSN: [id number]  Arrival date and time: 10/07/22 1552   Event Date/Time   First Provider Initiated Contact with Patient 10/07/22 1626      Chief Complaint  Patient presents with   Abdominal Pain   Abdominal Pain   Tamara Rios is a 26 y.o. G1P0 in early pregnancy who presents to MAU with chief complaint of lower abdominal pain. This is a new problem, onset three weeks ago. Abdomen also feels "heavy" and is accompanied by brown-tinged discharge. She denies vaginal bleeding. She denies dysuria, abdominal tenderness, fever or recent illness. Pain score is 7/10. She denies aggravating or alleviating factors.  OB History     Gravida  1   Para      Term      Preterm      AB      Living         SAB      IAB      Ectopic      Multiple      Live Births              No past medical history on file.  Past Surgical History:  Procedure Laterality Date   TONSILLECTOMY      Family History  Problem Relation Age of Onset   Healthy Mother    Hypertension Father    Asthma Brother    Allergic rhinitis Brother     Social History   Tobacco Use   Smoking status: Never   Smokeless tobacco: Never  Vaping Use   Vaping Use: Never used  Substance Use Topics   Alcohol use: Yes    Comment: Occasional   Drug use: Never    Allergies:  Allergies  Allergen Reactions   Peanut-Containing Drug Products Anaphylaxis   Cucumber Extract    Other     tREE NUTS-    No medications prior to admission.    Review of Systems  Gastrointestinal:  Positive for abdominal pain.  Genitourinary:  Positive for vaginal discharge.  All other systems reviewed and are negative.  Physical Exam   Blood pressure (!) 140/82, pulse 88, temperature 98.3 F (36.8 C), temperature source Oral, resp. rate 20, height 5\' 6"  (1.676 m), weight 85.1 kg, last menstrual period 07/18/2022, SpO2 100 %.  Physical Exam Vitals and nursing note reviewed. Exam conducted with a  chaperone present.  Constitutional:      Appearance: She is well-developed. She is not ill-appearing.  Cardiovascular:     Rate and Rhythm: Normal rate.  Pulmonary:     Effort: Pulmonary effort is normal.  Abdominal:     Palpations: Abdomen is soft.     Tenderness: There is no abdominal tenderness.  Neurological:     Mental Status: She is alert.     MAU Course  Procedures  MDM  --11 weeks by certain LMP. Patient consents to attempting bedside ultrasound to confirm FHT. Will order formal 09/17/2022 if unable to visualize  Orders Placed This Encounter  Procedures   Wet prep, genital   Urinalysis, Routine w reflex microscopic Urine, Clean Catch   Pregnancy, urine POC    Vitals:   10/07/22 1620  BP: (!) 140/82  Pulse: 88  Resp: 20  Temp: 98.3 F (36.8 C)  SpO2: 100%   Recent Results (from the past 2160 hour(s))  Pregnancy, urine POC     Status: Abnormal   Collection Time: 10/07/22  4:09 PM  Result Value Ref Range  Preg Test, Ur POSITIVE (A) NEGATIVE    Comment:        THE SENSITIVITY OF THIS METHODOLOGY IS >24 mIU/mL   Urinalysis, Routine w reflex microscopic Urine, Clean Catch     Status: None   Collection Time: 10/07/22  4:11 PM  Result Value Ref Range   Color, Urine YELLOW YELLOW   APPearance CLEAR CLEAR   Specific Gravity, Urine 1.015 1.005 - 1.030   pH 6.0 5.0 - 8.0   Glucose, UA NEGATIVE NEGATIVE mg/dL   Hgb urine dipstick NEGATIVE NEGATIVE   Bilirubin Urine NEGATIVE NEGATIVE   Ketones, ur NEGATIVE NEGATIVE mg/dL   Protein, ur NEGATIVE NEGATIVE mg/dL   Nitrite NEGATIVE NEGATIVE   Leukocytes,Ua NEGATIVE NEGATIVE    Comment: Performed at [facility], [address] [place], [place] [address]  GC/Chlamydia probe amp ([place])not at [facility]     Status: None   Collection Time: 10/07/22  4:18 PM  Result Value Ref Range   Neisseria Gonorrhea Negative    Chlamydia Negative    Comment Normal Reference Ranger Chlamydia - Negative    Comment      Normal  Reference Range Neisseria Gonorrhea - Negative  Wet prep, genital     Status: None   Collection Time: 10/07/22  4:35 PM  Result Value Ref Range   Yeast Wet Prep HPF POC NONE SEEN NONE SEEN   Trich, Wet Prep NONE SEEN NONE SEEN   Clue Cells Wet Prep HPF POC NONE SEEN NONE SEEN   WBC, Wet Prep HPF POC <10 <10   Sperm NONE SEEN     Comment: Performed at [facility], [address], [place], [place] [address]   Assessment and Plan  --26 y.o. G1P0 in first trimester --FHT confirmed via cardiac flicker --Patient confirms she can see cardiac flicker --No acute concerns or findings to necessitate additional evaluation --Discharge home in stable condition  Calvert Cantor, MSA, MSN, CNM

## 2022-10-15 ENCOUNTER — Inpatient Hospital Stay (HOSPITAL_COMMUNITY)
Admission: AD | Admit: 2022-10-15 | Discharge: 2022-10-16 | Disposition: A | Discharge status: Home / Self Care | Payer: 59 | Attending: Obstetrics and Gynecology | Admitting: Obstetrics and Gynecology

## 2022-10-15 DIAGNOSIS — O4690 Antepartum hemorrhage, unspecified, unspecified trimester: Secondary | ICD-10-CM

## 2022-10-15 DIAGNOSIS — O020 Blighted ovum and nonhydatidiform mole: Secondary | ICD-10-CM | POA: Diagnosis not present

## 2022-10-15 DIAGNOSIS — Z3A12 12 weeks gestation of pregnancy: Secondary | ICD-10-CM | POA: Diagnosis not present

## 2022-10-15 DIAGNOSIS — R1030 Lower abdominal pain, unspecified: Secondary | ICD-10-CM

## 2022-10-15 DIAGNOSIS — O26891 Other specified pregnancy related conditions, first trimester: Secondary | ICD-10-CM | POA: Insufficient documentation

## 2022-10-15 DIAGNOSIS — O4691 Antepartum hemorrhage, unspecified, first trimester: Secondary | ICD-10-CM | POA: Diagnosis not present

## 2022-10-15 LAB — URINALYSIS, ROUTINE W REFLEX MICROSCOPIC
Bilirubin Urine: NEGATIVE
Glucose, UA: NEGATIVE mg/dL
Hgb urine dipstick: NEGATIVE
Ketones, ur: NEGATIVE mg/dL
Leukocytes,Ua: NEGATIVE
Nitrite: NEGATIVE
Protein, ur: NEGATIVE mg/dL
Specific Gravity, Urine: 1.01 (ref 1.005–1.030)
pH: 6 (ref 5.0–8.0)

## 2022-10-15 LAB — WET PREP, GENITAL
Sperm: NONE SEEN
Trich, Wet Prep: NONE SEEN
WBC, Wet Prep HPF POC: <10 (ref <10)
Yeast Wet Prep HPF POC: NONE SEEN

## 2022-10-15 MED ORDER — METRONIDAZOLE 500 MG PO TABS
500.0000 mg | ORAL_TABLET | Freq: Once | ORAL | Status: AC
  Administered 2022-10-16: 500 mg via ORAL
  Filled 2022-10-15: qty 1

## 2022-10-15 NOTE — MAU Note (Signed)
.  Tamara Rios is a 27 y.o. at [redacted]w[redacted]d here in MAU reporting dark brown or pink spotting for 3 wks. This morning reports a lot of pain and pressure in lower abd for 44mins. Thinks maybe gas. Had BM and felt alittle better. Tonight had same type of pain before coming to the hospital. No pain currently. Was to have u/s today at office but u/s tech had to leave so had to cancel her appt  Onset of complaint: 3wks Pain score: 0 Vitals:   10/15/22 2241 10/15/22 2244  BP:  (!) 151/79  Pulse: (!) 109   Resp: 20   Temp: 98.4 F (36.9 C)   SpO2: 100%      LNL:[id number] to hear Lab orders placed from triage:  u/a

## 2022-10-15 NOTE — MAU Provider Note (Signed)
MAU Provider Note  History  [id number]  Arrival date and time: 10/15/22 2231   Chief Complaint  Patient presents with   Vaginal Bleeding    HPI Tamara Rios is a 27 y.o. G1P0 at [redacted]w[redacted]d by LMP with PMHx notable for none, who presents for spotting from vagina and cramping. No pain at this time but had some earlier today in the lower abd and pressure. Brown/pink vaginal spotting for 3wks. She felt like her pelvic/abd pain was worse this AM, rated 7/10. Now a 5/10. Mild nausea sx, nothing apart to what she has felt this pregnancy. She noted a new odor to her vaginal discharge, but no itching, or vaginal irritation.  Vaginal bleeding: Yes LOF: No Fetal Movement: No Contractions: No --/--/PENDING (01/05 0143)  OB History     Gravida  1   Para      Term      Preterm      AB      Living         SAB      IAB      Ectopic      Multiple      Live Births              History reviewed. No pertinent past medical history.  Past Surgical History:  Procedure Laterality Date   TONSILLECTOMY      Family History  Problem Relation Age of Onset   Healthy Mother    Hypertension Father    Asthma Brother    Allergic rhinitis Brother     Social History   Socioeconomic History   Marital status: Single    Spouse name: Not on file   Number of children: Not on file   Years of education: Not on file   Highest education level: Not on file  Occupational History   Not on file  Tobacco Use   Smoking status: Never   Smokeless tobacco: Never  Vaping Use   Vaping Use: Never used  Substance and Sexual Activity   Alcohol use: Yes    Comment: Occasional   Drug use: Never   Sexual activity: Yes    Partners: Male    Birth control/protection: None  Other Topics Concern   Not on file  Social History Narrative   Not on file   Social Determinants of Health   Financial Resource Strain: Not on file  Food Insecurity: Not on file  Transportation Needs: Not on file   Physical Activity: Not on file  Stress: Not on file  Social Connections: Not on file  Intimate Partner Violence: Not on file    Allergies  Allergen Reactions   Peanut-Containing Drug Products Anaphylaxis   Cucumber Extract    Other     tREE NUTS-    No current facility-administered medications on file prior to encounter.   Current Outpatient Medications on File Prior to Encounter  Medication Sig Dispense Refill   Prenatal Vit-Fe Fumarate-FA (PRENATAL MULTIVITAMIN) TABS tablet Take 1 tablet by mouth daily at 12 noon.     EPINEPHrine 0.3 mg/0.3 mL IJ SOAJ injection Inject 0.3 mg into the muscle as needed for anaphylaxis. 1 each 2   fluticasone (FLONASE) 50 MCG/ACT nasal spray Place into the nose. (Patient not taking: Reported on 12/19/2021)     metroNIDAZOLE (FLAGYL) 500 MG tablet Take 1 tablet (500 mg total) by mouth 2 (two) times daily. 14 tablet 0   predniSONE (DELTASONE) 20 MG tablet Take 60 mg daily x 2 days  then 40 mg daily x 2 days then 20 mg daily x 2 days (Patient not taking: Reported on 12/19/2021) 12 tablet 0     Review of Systems  Constitutional:  Negative for appetite change, chills and fever.  HENT:  Negative for congestion, facial swelling and postnasal drip.   Eyes:  Negative for photophobia.  Respiratory:  Negative for cough and shortness of breath.   Cardiovascular:  Negative for chest pain.  Gastrointestinal:  Positive for abdominal pain. Negative for nausea and vomiting.  Endocrine: Negative for polyuria.  Genitourinary:  Positive for vaginal bleeding. Negative for flank pain and pelvic pain.  Musculoskeletal:  Negative for arthralgias.  Skin:  Negative for rash.  Neurological:  Negative for weakness and light-headedness.  Psychiatric/Behavioral:  Negative for confusion.     Pertinent positives and negative per HPI, all others reviewed and negative  Physical Exam   BP (!) 151/79 (BP Location: Right Arm)   Pulse (!) 109   Temp 98.4 F (36.9 C)   Resp  20   Ht 5\' 6"  (1.676 m)   Wt 87.5 kg   LMP 07/18/2022   SpO2 100%   BMI 31.15 kg/m   Patient Vitals for the past 24 hrs:  BP Temp Pulse Resp SpO2 Height Weight  10/15/22 2244 (!) 151/79 -- -- -- -- -- --  10/15/22 2241 -- 98.4 F (36.9 C) (!) 109 20 100 % 5\' 6"  (1.676 m) 87.5 kg    Physical Exam Vitals reviewed. Exam conducted with a chaperone present.  Constitutional:      Appearance: Normal appearance.  HENT:     Head: Normocephalic and atraumatic.     Right Ear: External ear normal.     Left Ear: External ear normal.  Cardiovascular:     Rate and Rhythm: Normal rate and regular rhythm.  Pulmonary:     Effort: Pulmonary effort is normal.     Breath sounds: Normal breath sounds.  Abdominal:     General: Abdomen is flat.     Palpations: Abdomen is soft.  Genitourinary:    Exam position: Lithotomy position.     Vagina: Normal.     Cervix: Discharge and cervical bleeding present. No friability.  Skin:    General: Skin is warm.     Capillary Refill: Capillary refill takes less than 2 seconds.  Psychiatric:        Mood and Affect: Mood normal.    Cervical Exam  N/a  Bedside Ultrasound Done Pt informed that the ultrasound is considered a limited OB ultrasound and is not intended to be a complete ultrasound exam.  Patient also informed that the ultrasound is not being completed with the intent of assessing for fetal or placental anomalies or any pelvic abnormalities.  Explained that the purpose of today's ultrasound is to assess for  viability.  Patient acknowledges the purpose of the exam and the limitations of the study.    Findings: No embryo, or fetal products noted. Central heterogenous area with multiple anechoic spaces. Can be depicted as a "snowstorm" presentation in utero.    Labs Results for orders placed or performed during the hospital encounter of 10/15/22 (from the past 24 hour(s))  Wet prep, genital     Status: Abnormal   Collection Time: 10/15/22 10:52  PM   Specimen: PATH Cytology Cervicovaginal Ancillary Only  Result Value Ref Range   Yeast Wet Prep HPF POC NONE SEEN NONE SEEN   Trich, Wet Prep NONE SEEN NONE SEEN  Clue Cells Wet Prep HPF POC PRESENT (A) NONE SEEN   WBC, Wet Prep HPF POC <10 <10   Sperm NONE SEEN   Urinalysis, Routine w reflex microscopic PATH Cytology Cervicovaginal Ancillary Only     Status: Abnormal   Collection Time: 10/15/22 10:52 PM  Result Value Ref Range   Color, Urine STRAW (A) YELLOW   APPearance CLEAR CLEAR   Specific Gravity, Urine 1.010 1.005 - 1.030   pH 6.0 5.0 - 8.0   Glucose, UA NEGATIVE NEGATIVE mg/dL   Hgb urine dipstick NEGATIVE NEGATIVE   Bilirubin Urine NEGATIVE NEGATIVE   Ketones, ur NEGATIVE NEGATIVE mg/dL   Protein, ur NEGATIVE NEGATIVE mg/dL   Nitrite NEGATIVE NEGATIVE   Leukocytes,Ua NEGATIVE NEGATIVE  ABO/Rh     Status: None (Preliminary result)   Collection Time: 10/16/22  1:43 AM  Result Value Ref Range   ABO/RH(D) PENDING     Imaging US OB LESS THAN 14 WEEKS WITH OB TRANSVAGINAL  Result Date: 10/16/2022 CLINICAL DATA:  Vaginal bleeding, positive pregnancy test EXAM: OBSTETRIC <14 WK [place] AND TRANSVAGINAL OB US TECHNIQUE: Both transabdominal and transvaginal ultrasound examinations were performed for complete evaluation of the gestation as well as the maternal uterus, adnexal regions, and pelvic cul-de-sac. Transvaginal technique was performed to assess early pregnancy. COMPARISON:  None Available. FINDINGS: Intrauterine gestational sac: Absent Maternal uterus/adnexae: The uterus is significantly enlarged with enlarged prominent endometrium which is predominately hyperechoic with multiple cystic structures within most consistent with a molar pregnancy. This measures 13 cm in length, 7 cm in AP dimension and 6.3 cm in transverse dimension. The ovaries are within normal limits. IMPRESSION: Hyperechoic multi-cystic structure within the uterus most consistent with a molar pregnancy. Critical  Value/emergent results were called by telephone at the time of interpretation on 10/16/2022 at 1:38 am to Dr. Simmie Davies , who verbally acknowledged these results. Electronically Signed   By: Inez Catalina M.D.   On: 10/16/2022 01:38    MAU Course  Procedures Lab Orders         Wet prep, genital         Urinalysis, Routine w reflex microscopic         hCG, quantitative, pregnancy         CBC with Differential/Platelet     Meds ordered this encounter  Medications   metroNIDAZOLE (FLAGYL) tablet 500 mg   metroNIDAZOLE (FLAGYL) 500 MG tablet    Sig: Take 1 tablet (500 mg total) by mouth 2 (two) times daily for 13 doses.    Dispense:  13 tablet    Refill:  0   acetaminophen (TYLENOL) 325 MG tablet    Sig: Take 2 tablets (650 mg total) by mouth every 6 (six) hours as needed for moderate pain.    Dispense:  30 tablet    Refill:  0   Imaging Orders         US OB LESS THAN 14 WEEKS WITH OB TRANSVAGINAL      MDM moderate  Assessment and Plan  MOLAR PREGNANCY Vaginal bleeding Abd pain [redacted] weeks pregnant   Pt here for VB for 3 weeks, now with intermittent lower abd pain. Speculum exam with brown red discharge noted per cervical os. Wet prep positive for clue cells. Metronidazole given here and sent to pharmacy to take for 7 days. GC/Chlamydia pending. UA unremarkable. Bedside US with unclear fetal cardiac activity, central heterogenous mass with anechoic spaces. Formal US ordered and it showed Hyperechoic multi-cystic structure within the  uterus most consistent with a molar pregnancy. bHCG, ABO/Rh, and CBC collected.  Pt given precautions for bleeding and pain. Tylenol given for home.   Message sent to [facility] for D&E scheduling and follow up bHCG testing.  Dispo: discharged to home in stable condition.   Discharge Instructions     Discharge patient   Complete by: As directed    Discharge disposition: 01-Home or Self Care   Discharge patient date: 10/16/2022      Allergies  as of 10/16/2022       Reactions   Peanut-containing Drug Products Anaphylaxis   Cucumber Extract    Other    tREE NUTS-        Medication List     TAKE these medications    acetaminophen 325 MG tablet Commonly known as: Tylenol Take 2 tablets (650 mg total) by mouth every 6 (six) hours as needed for moderate pain.   EPINEPHrine 0.3 mg/0.3 mL Soaj injection Commonly known as: EPI-PEN Inject 0.3 mg into the muscle as needed for anaphylaxis.   fluticasone 50 MCG/ACT nasal spray Commonly known as: FLONASE Place into the nose.   metroNIDAZOLE 500 MG tablet Commonly known as: Flagyl Take 1 tablet (500 mg total) by mouth 2 (two) times daily for 13 doses.   predniSONE 20 MG tablet Commonly known as: DELTASONE Take 60 mg daily x 2 days then 40 mg daily x 2 days then 20 mg daily x 2 days   prenatal multivitamin Tabs tablet Take 1 tablet by mouth daily at 12 noon.        Myrtie Hawk, DO FMOB Fellow, Faculty practice [facility] Center for [facility] 10/16/22  2:03 AM

## 2022-10-16 ENCOUNTER — Inpatient Hospital Stay (HOSPITAL_COMMUNITY): Payer: 59

## 2022-10-16 ENCOUNTER — Encounter (HOSPITAL_COMMUNITY): Payer: Self-pay | Admitting: Obstetrics and Gynecology

## 2022-10-16 ENCOUNTER — Telehealth: Payer: Self-pay | Admitting: Obstetrics and Gynecology

## 2022-10-16 DIAGNOSIS — O020 Blighted ovum and nonhydatidiform mole: Secondary | ICD-10-CM

## 2022-10-16 DIAGNOSIS — R1030 Lower abdominal pain, unspecified: Secondary | ICD-10-CM

## 2022-10-16 DIAGNOSIS — O4691 Antepartum hemorrhage, unspecified, first trimester: Secondary | ICD-10-CM

## 2022-10-16 DIAGNOSIS — Z3A12 12 weeks gestation of pregnancy: Secondary | ICD-10-CM

## 2022-10-16 LAB — GC/CHLAMYDIA PROBE AMP (~~LOC~~) NOT AT ARMC
Chlamydia: NEGATIVE
Comment: NEGATIVE
Comment: NORMAL
Neisseria Gonorrhea: NEGATIVE

## 2022-10-16 LAB — CBC WITH DIFFERENTIAL/PLATELET
Abs Immature Granulocytes: 0.14 10*3/uL — ABNORMAL HIGH (ref 0.00–0.07)
Basophils Absolute: 0.1 10*3/uL (ref 0.0–0.1)
Basophils Relative: 0 %
Eosinophils Absolute: 0.2 10*3/uL (ref 0.0–0.5)
Eosinophils Relative: 1 %
HCT: 28.1 % — ABNORMAL LOW (ref 36.0–46.0)
Hemoglobin: 9.5 g/dL — ABNORMAL LOW (ref 12.0–15.0)
Immature Granulocytes: 1 %
Lymphocytes Relative: 24 %
Lymphs Abs: 3.6 10*3/uL (ref 0.7–4.0)
MCH: 28.7 pg (ref 26.0–34.0)
MCHC: 33.8 g/dL (ref 30.0–36.0)
MCV: 84.9 fL (ref 80.0–100.0)
Monocytes Absolute: 1.3 10*3/uL — ABNORMAL HIGH (ref 0.1–1.0)
Monocytes Relative: 9 %
Neutro Abs: 9.5 10*3/uL — ABNORMAL HIGH (ref 1.7–7.7)
Neutrophils Relative %: 65 %
Platelets: 306 10*3/uL (ref 150–400)
RBC: 3.31 MIL/uL — ABNORMAL LOW (ref 3.87–5.11)
RDW: 14.5 % (ref 11.5–15.5)
WBC: 14.8 10*3/uL — ABNORMAL HIGH (ref 4.0–10.5)
nRBC: 0 % (ref 0.0–0.2)

## 2022-10-16 LAB — ABO/RH: ABO/RH(D): A POS

## 2022-10-16 LAB — HCG, QUANTITATIVE, PREGNANCY: hCG, Beta Chain, Quant, S: >250000 m[IU]/mL — ABNORMAL HIGH (ref <5)

## 2022-10-16 MED ORDER — METRONIDAZOLE 500 MG PO TABS: 500.0000 mg | ORAL_TABLET | Freq: Two times a day (BID) | ORAL | 0 refills | Status: AC

## 2022-10-16 MED ORDER — ACETAMINOPHEN 325 MG PO TABS: 650.0000 mg | ORAL_TABLET | Freq: Four times a day (QID) | ORAL | 0 refills | Status: DC | PRN

## 2022-10-16 NOTE — Progress Notes (Signed)
S.D.W- Instructions- Unable to contact the patient, these instructions were LVM.    Your procedure is scheduled on Mon., Jan. 8, 2024 from 8:30AM-8:55AM.  Report to [facility] Main Entrance "A" at 6:00 A.M., then check in with the Admitting office.  Call this number if you have problems the morning of surgery:  [phone]             If you experience any cold or flu symptoms such as cough, fever, chills, shortness of breath, etc. between now and your scheduled surgery, please notify us at the above         number.  Masks are now required throughout our facilities due to the increasing cases of Covid, Flu, and RSV infections.   Remember:  Do not eat or drink after midnight on Jan. 7th    Take these medicines the morning of surgery with A SIP OF WATER: If Needed: Acetaminophen(Tylenol)   As of today, STOP taking any Aspirin (unless otherwise instructed by your surgeon) Aleve, Naproxen, Ibuprofen, Motrin, Advil, Goody's, BC's, all herbal medications, fish oil, and all vitamins.  Do not wear jewelry or makeup. Do not wear lotions, powders, perfumes, or deodorant. Do not shave 48 hours prior to surgery.   Do not bring valuables to the hospital. Do not wear nail polish, gel polish, artificial nails, or any other type of covering on natural nails (fingers and toes) If you have artificial nails or gel coating that need to be removed by a nail salon, please have this removed prior to surgery. Artificial nails or gel coating may interfere with anesthesia's ability to adequately monitor your vital signs.  [facility] is not responsible for any belongings or valuables.    Do NOT Smoke (Tobacco/Vaping)  24 hours prior to your procedure  If you use a CPAP at night, you may bring your mask for your overnight stay.   Contacts, glasses, hearing aids, dentures or partials may not be worn into surgery, please bring cases for these belongings   For patients admitted to the hospital, discharge time  will be determined by your treatment team.   Patients discharged the day of surgery will not be allowed to drive home, and someone needs to stay with them for 24 hours.  Special instructions:    Oral Hygiene is also important to reduce your risk of infection.  Remember - BRUSH YOUR TEETH THE MORNING OF SURGERY WITH YOUR REGULAR TOOTHPASTE  [facility]- Preparing For Surgery  Before surgery, you can play an important role. Because skin is not sterile, your skin needs to be as free of germs as possible. You can reduce the number of germs on your skin by washing with Antibacterial Soap before surgery.     Please follow these instructions carefully.     Shower the NIGHT BEFORE SURGERY and the MORNING OF SURGERY with Antibacterial Soap.   Pat yourself dry with a CLEAN TOWEL.  Wear CLEAN PAJAMAS to bed the night before surgery  Place CLEAN SHEETS on your bed the night before your surgery  DO NOT SLEEP WITH PETS.  Day of Surgery:  Take a shower with Antibacterial soap. Wear Clean/Comfortable clothing the morning of surgery Do not apply any deodorants/lotions.   Remember to brush your teeth WITH YOUR REGULAR TOOTHPASTE.   If you test positive for Covid, or been in contact with anyone that has tested positive in the last 10 days, please notify your surgeon.  SURGICAL WAITING ROOM VISITATION Patients having surgery or a  procedure may have no more than 2 support people in the waiting area - these visitors may rotate.   Children under the age of 72 must have an adult with them who is not the patient. If the patient needs to stay at the hospital during part of their recovery, the visitor guidelines for inpatient rooms apply. Pre-op nurse will coordinate an appropriate time for 1 support person to accompany patient in pre-op.  This support person may not rotate.   Please refer to the [facility] website for the visitor guidelines for Inpatients (after your surgery is over and you are in a  regular room).

## 2022-10-16 NOTE — Telephone Encounter (Signed)
Telephone call to patient regarding surgery posting.  Patient not in, left message for her to call.   Posted for 10/21/22 at 8:45am at [facility].

## 2022-10-16 NOTE — Telephone Encounter (Signed)
Patient returned call.  Gave patient surgery date/time/instructions.   Patient requested earlier date for Monday or Tuesday.  I told patient I was on waitlist and would follow up if we are able to move up case.

## 2022-10-16 NOTE — MAU Note (Signed)
Dr Dorena Cookey in to discuss u/s results with pt and her husband

## 2022-10-16 NOTE — Progress Notes (Signed)
Written and verbal d/c instructions given and understanding voiced. 

## 2022-10-18 NOTE — Anesthesia Preprocedure Evaluation (Signed)
Anesthesia Evaluation  Patient identified by MRN, date of birth, ID band Patient awake    Reviewed: Allergy & Precautions, NPO status , Patient's Chart, lab work & pertinent test results  History of Anesthesia Complications Negative for: history of anesthetic complications  Airway Mallampati: I  TM Distance: >3 FB Neck ROM: Full    Dental  (+) Chipped, Dental Advisory Given   Pulmonary neg pulmonary ROS   breath sounds clear to auscultation       Cardiovascular negative cardio ROS  Rhythm:Regular Rate:Normal     Neuro/Psych negative neurological ROS     GI/Hepatic negative GI ROS, Neg liver ROS,,,  Endo/Other  negative endocrine ROS    Renal/GU negative Renal ROS     Musculoskeletal   Abdominal  (+) + obese  Peds  Hematology  (+) Blood dyscrasia (Hb 9.5), anemia   Anesthesia Other Findings   Reproductive/Obstetrics (+) Pregnancy (molar)                             Anesthesia Physical Anesthesia Plan  ASA: 1  Anesthesia Plan: General   Post-op Pain Management: Tylenol PO (pre-op)*   Induction: Intravenous  PONV Risk Score and Plan: 3 and Dexamethasone and Scopolamine patch - Pre-op  Airway Management Planned: LMA  Additional Equipment: None  Intra-op Plan:   Post-operative Plan:   Informed Consent: I have reviewed the patients History and Physical, chart, labs and discussed the procedure including the risks, benefits and alternatives for the proposed anesthesia with the patient or authorized representative who has indicated his/her understanding and acceptance.     Dental advisory given  Plan Discussed with: CRNA and Surgeon  Anesthesia Plan Comments:        Anesthesia Quick Evaluation

## 2022-10-19 ENCOUNTER — Encounter (HOSPITAL_COMMUNITY): Payer: Self-pay | Admitting: Obstetrics and Gynecology

## 2022-10-19 ENCOUNTER — Encounter (HOSPITAL_COMMUNITY)
Admission: RE | Disposition: A | Discharge status: Home / Self Care | Payer: Self-pay | Source: Home / Self Care | Attending: Obstetrics and Gynecology

## 2022-10-19 ENCOUNTER — Ambulatory Visit (HOSPITAL_BASED_OUTPATIENT_CLINIC_OR_DEPARTMENT_OTHER): Payer: 59 | Admitting: Certified Registered"

## 2022-10-19 ENCOUNTER — Other Ambulatory Visit: Payer: Self-pay

## 2022-10-19 ENCOUNTER — Ambulatory Visit (HOSPITAL_COMMUNITY): Payer: 59 | Admitting: Certified Registered"

## 2022-10-19 ENCOUNTER — Ambulatory Visit (HOSPITAL_COMMUNITY)
Admission: RE | Admit: 2022-10-19 | Discharge: 2022-10-19 | Disposition: A | Discharge status: Home / Self Care | Payer: 59 | Source: Home / Self Care | Attending: Obstetrics and Gynecology | Admitting: Obstetrics and Gynecology

## 2022-10-19 DIAGNOSIS — D62 Acute posthemorrhagic anemia: Secondary | ICD-10-CM | POA: Diagnosis not present

## 2022-10-19 DIAGNOSIS — O020 Blighted ovum and nonhydatidiform mole: Secondary | ICD-10-CM

## 2022-10-19 DIAGNOSIS — R519 Headache, unspecified: Secondary | ICD-10-CM | POA: Diagnosis not present

## 2022-10-19 DIAGNOSIS — Z3A14 14 weeks gestation of pregnancy: Secondary | ICD-10-CM | POA: Diagnosis not present

## 2022-10-19 DIAGNOSIS — E669 Obesity, unspecified: Secondary | ICD-10-CM | POA: Insufficient documentation

## 2022-10-19 DIAGNOSIS — O010 Classical hydatidiform mole: Secondary | ICD-10-CM | POA: Insufficient documentation

## 2022-10-19 DIAGNOSIS — O99210 Obesity complicating pregnancy, unspecified trimester: Secondary | ICD-10-CM | POA: Insufficient documentation

## 2022-10-19 DIAGNOSIS — Z3A00 Weeks of gestation of pregnancy not specified: Secondary | ICD-10-CM | POA: Insufficient documentation

## 2022-10-19 DIAGNOSIS — Z3A13 13 weeks gestation of pregnancy: Secondary | ICD-10-CM

## 2022-10-19 HISTORY — PX: DILATION AND EVACUATION: SHX1459

## 2022-10-19 HISTORY — DX: Other seasonal allergic rhinitis: J30.2

## 2022-10-19 LAB — CBC
HCT: 20.1 % — ABNORMAL LOW (ref 36.0–46.0)
Hemoglobin: 6.6 g/dL — CL (ref 12.0–15.0)
MCH: 29.1 pg (ref 26.0–34.0)
MCHC: 32.8 g/dL (ref 30.0–36.0)
MCV: 88.5 fL (ref 80.0–100.0)
Platelets: 259 10*3/uL (ref 150–400)
RBC: 2.27 MIL/uL — ABNORMAL LOW (ref 3.87–5.11)
RDW: 14.9 % (ref 11.5–15.5)
WBC: 19 10*3/uL — ABNORMAL HIGH (ref 4.0–10.5)
nRBC: 0 % (ref 0.0–0.2)

## 2022-10-19 LAB — BASIC METABOLIC PANEL
Anion gap: 5 (ref 5–15)
BUN: 7 mg/dL (ref 6–20)
CO2: 23 mmol/L (ref 22–32)
Calcium: 7.8 mg/dL — ABNORMAL LOW (ref 8.9–10.3)
Chloride: 105 mmol/L (ref 98–111)
Creatinine, Ser: 0.96 mg/dL (ref 0.44–1.00)
GFR, Estimated: >60 mL/min (ref >60)
Glucose, Bld: 165 mg/dL — ABNORMAL HIGH (ref 70–99)
Potassium: 3.6 mmol/L (ref 3.5–5.1)
Sodium: 133 mmol/L — ABNORMAL LOW (ref 135–145)

## 2022-10-19 LAB — PREPARE RBC (CROSSMATCH)

## 2022-10-19 LAB — GLUCOSE, CAPILLARY: Glucose-Capillary: 153 mg/dL — ABNORMAL HIGH (ref 70–99)

## 2022-10-19 SURGERY — DILATION AND EVACUATION, UTERUS
Anesthesia: General

## 2022-10-19 MED ORDER — OXYCODONE HCL 5 MG PO TABS
5.0000 mg | ORAL_TABLET | Freq: Once | ORAL | Status: AC | PRN
  Administered 2022-10-19: 5 mg via ORAL

## 2022-10-19 MED ORDER — ORAL CARE MOUTH RINSE: 15.0000 mL | Freq: Once | OROMUCOSAL | Status: AC

## 2022-10-19 MED ORDER — PROPOFOL 10 MG/ML IV BOLUS
INTRAVENOUS | Status: AC
  Filled 2022-10-19: qty 20

## 2022-10-19 MED ORDER — DOXYCYCLINE HYCLATE 100 MG IV SOLR
200.0000 mg | INTRAVENOUS | Status: AC
  Administered 2022-10-19: 200 mg via INTRAVENOUS
  Filled 2022-10-19: qty 200

## 2022-10-19 MED ORDER — PROPOFOL 10 MG/ML IV BOLUS
INTRAVENOUS | Status: DC | PRN
  Administered 2022-10-19: 200 mg via INTRAVENOUS

## 2022-10-19 MED ORDER — LACTATED RINGERS IV SOLN: INTRAVENOUS | Status: DC

## 2022-10-19 MED ORDER — SCOPOLAMINE 1 MG/3DAYS TD PT72
1.0000 | MEDICATED_PATCH | TRANSDERMAL | Status: DC
  Administered 2022-10-19: 1.5 mg via TRANSDERMAL
  Filled 2022-10-19: qty 1

## 2022-10-19 MED ORDER — LABETALOL HCL 5 MG/ML IV SOLN
INTRAVENOUS | Status: AC
  Filled 2022-10-19: qty 4

## 2022-10-19 MED ORDER — FENTANYL CITRATE (PF) 250 MCG/5ML IJ SOLN
INTRAMUSCULAR | Status: DC | PRN
  Administered 2022-10-19 (×2): 50 ug via INTRAVENOUS

## 2022-10-19 MED ORDER — MIDAZOLAM HCL 2 MG/2ML IJ SOLN
INTRAMUSCULAR | Status: AC
  Filled 2022-10-19: qty 2

## 2022-10-19 MED ORDER — DEXAMETHASONE SODIUM PHOSPHATE 10 MG/ML IJ SOLN
INTRAMUSCULAR | Status: AC
  Filled 2022-10-19: qty 1

## 2022-10-19 MED ORDER — FENTANYL CITRATE (PF) 250 MCG/5ML IJ SOLN
INTRAMUSCULAR | Status: AC
  Filled 2022-10-19: qty 5

## 2022-10-19 MED ORDER — PROMETHAZINE HCL 25 MG/ML IJ SOLN
INTRAMUSCULAR | Status: AC
  Filled 2022-10-19: qty 1

## 2022-10-19 MED ORDER — SODIUM CHLORIDE 0.9% IV SOLUTION: Freq: Once | INTRAVENOUS | Status: DC

## 2022-10-19 MED ORDER — MIDAZOLAM HCL 2 MG/2ML IJ SOLN: 0.5000 mg | Freq: Once | INTRAMUSCULAR | Status: DC | PRN

## 2022-10-19 MED ORDER — OXYCODONE HCL 5 MG PO TABS
ORAL_TABLET | ORAL | Status: AC
  Filled 2022-10-19: qty 1

## 2022-10-19 MED ORDER — LIDOCAINE 2% (20 MG/ML) 5 ML SYRINGE
INTRAMUSCULAR | Status: AC
  Filled 2022-10-19: qty 10

## 2022-10-19 MED ORDER — LIDOCAINE-EPINEPHRINE 1 %-1:100000 IJ SOLN
INTRAMUSCULAR | Status: DC | PRN
  Administered 2022-10-19: 20 mL

## 2022-10-19 MED ORDER — MIDAZOLAM HCL 2 MG/2ML IJ SOLN
INTRAMUSCULAR | Status: DC | PRN
  Administered 2022-10-19: 2 mg via INTRAVENOUS

## 2022-10-19 MED ORDER — PHENYLEPHRINE 80 MCG/ML (10ML) SYRINGE FOR IV PUSH (FOR BLOOD PRESSURE SUPPORT)
PREFILLED_SYRINGE | INTRAVENOUS | Status: DC | PRN
  Administered 2022-10-19 (×5): 160 ug via INTRAVENOUS

## 2022-10-19 MED ORDER — ONDANSETRON HCL 4 MG/2ML IJ SOLN
INTRAMUSCULAR | Status: AC
  Filled 2022-10-19: qty 8

## 2022-10-19 MED ORDER — ONDANSETRON HCL 4 MG/2ML IJ SOLN
INTRAMUSCULAR | Status: DC | PRN
  Administered 2022-10-19: 4 mg via INTRAVENOUS

## 2022-10-19 MED ORDER — AMISULPRIDE (ANTIEMETIC) 5 MG/2ML IV SOLN
INTRAVENOUS | Status: AC
  Filled 2022-10-19: qty 4

## 2022-10-19 MED ORDER — OXYCODONE HCL 5 MG/5ML PO SOLN: 5.0000 mg | Freq: Once | ORAL | Status: AC | PRN

## 2022-10-19 MED ORDER — CHLORHEXIDINE GLUCONATE 0.12 % MT SOLN
15.0000 mL | Freq: Once | OROMUCOSAL | Status: AC
  Administered 2022-10-19: 15 mL via OROMUCOSAL
  Filled 2022-10-19: qty 15

## 2022-10-19 MED ORDER — FENTANYL CITRATE (PF) 100 MCG/2ML IJ SOLN: 25.0000 ug | INTRAMUSCULAR | Status: DC | PRN

## 2022-10-19 MED ORDER — MEPERIDINE HCL 25 MG/ML IJ SOLN: 6.2500 mg | INTRAMUSCULAR | Status: DC | PRN

## 2022-10-19 MED ORDER — SODIUM CHLORIDE (PF) 0.9 % IJ SOLN
INTRAMUSCULAR | Status: AC
  Filled 2022-10-19: qty 20

## 2022-10-19 MED ORDER — DEXAMETHASONE SODIUM PHOSPHATE 10 MG/ML IJ SOLN
INTRAMUSCULAR | Status: DC | PRN
  Administered 2022-10-19: 10 mg via INTRAVENOUS

## 2022-10-19 MED ORDER — LIDOCAINE 2% (20 MG/ML) 5 ML SYRINGE
INTRAMUSCULAR | Status: DC | PRN
  Administered 2022-10-19: 20 mg via INTRAVENOUS

## 2022-10-19 MED ORDER — PROMETHAZINE HCL 25 MG/ML IJ SOLN: 6.2500 mg | INTRAMUSCULAR | Status: DC | PRN

## 2022-10-19 MED ORDER — ACETAMINOPHEN 500 MG PO TABS
1000.0000 mg | ORAL_TABLET | Freq: Once | ORAL | Status: AC
  Administered 2022-10-19: 1000 mg via ORAL
  Filled 2022-10-19: qty 2

## 2022-10-19 MED ORDER — KETOROLAC TROMETHAMINE 30 MG/ML IJ SOLN
INTRAMUSCULAR | Status: DC | PRN
  Administered 2022-10-19: 30 mg via INTRAVENOUS

## 2022-10-19 MED ORDER — OXYTOCIN-SODIUM CHLORIDE 30-0.9 UT/500ML-% IV SOLN
INTRAVENOUS | Status: DC | PRN
  Administered 2022-10-19: 250 mL/h via INTRAVENOUS

## 2022-10-19 MED ORDER — AMISULPRIDE (ANTIEMETIC) 5 MG/2ML IV SOLN
10.0000 mg | Freq: Once | INTRAVENOUS | Status: AC | PRN
  Administered 2022-10-19: 10 mg via INTRAVENOUS

## 2022-10-19 MED ORDER — DEXAMETHASONE SODIUM PHOSPHATE 10 MG/ML IJ SOLN
INTRAMUSCULAR | Status: AC
  Filled 2022-10-19: qty 4

## 2022-10-19 SURGICAL SUPPLY — 20 items
FILTER UTR ASPR ASSEMBLY (MISCELLANEOUS) ×1 IMPLANT
GLOVE BIO SURGEON STRL SZ7 (GLOVE) ×1 IMPLANT
GLOVE BIOGEL PI IND STRL 6.5 (GLOVE) IMPLANT
GLOVE BIOGEL PI IND STRL 7.0 (GLOVE) ×1 IMPLANT
GOWN STRL REUS W/ TWL LRG LVL3 (GOWN DISPOSABLE) ×2 IMPLANT
GOWN STRL REUS W/TWL LRG LVL3 (GOWN DISPOSABLE) ×2
HOSE CONNECTING 18IN BERKELEY (TUBING) ×1 IMPLANT
KIT BERKELEY 1ST TRI 3/8 NO TR (MISCELLANEOUS) ×1 IMPLANT
KIT BERKELEY 1ST TRIMESTER 3/8 (MISCELLANEOUS) ×1 IMPLANT
NS IRRIG 1000ML POUR BTL (IV SOLUTION) ×1 IMPLANT
PACK VAGINAL MINOR WOMEN LF (CUSTOM PROCEDURE TRAY) ×1 IMPLANT
PAD OB MATERNITY 4.3X12.25 (PERSONAL CARE ITEMS) ×1 IMPLANT
SET BERKELEY SUCTION TUBING (SUCTIONS) ×1 IMPLANT
TOWEL GREEN STERILE FF (TOWEL DISPOSABLE) ×1 IMPLANT
TRAP TISSUE FILTER (MISCELLANEOUS) IMPLANT
UNDERPAD 30X36 HEAVY ABSORB (UNDERPADS AND DIAPERS) ×1 IMPLANT
VACURETTE 10 RIGID CVD (CANNULA) IMPLANT
VACURETTE 12 RIGID CVD (CANNULA) IMPLANT
VACURETTE 7MM STRAIGHT (CANNULA) IMPLANT
VACURRETTE 7MM STRAIGHT (CANNULA) ×1

## 2022-10-19 NOTE — Progress Notes (Signed)
Pt was being wheeled out by nurse tech when she started to feel nauseous and started vomiting. This RN was notified. Pt wheeled back to PACU and transferred to stretcher. Pt was diaphoretic. VSS stable, BP 103/67 MAP 68, HR 80, O2 100% on RA, CBG 150. Pt stated she "felt better". Annye Asa MD and Gale Journey MD notified of event. RN started new IV. CBC and BMP drawn, antiemetic given. Pt now resting in stretcher with no complaints. Pt tolerating PO intake. Pt family updated regarding status.

## 2022-10-19 NOTE — H&P (Signed)
[facility]st[facility] Gynecology Attending History and Physical  Tamara Rios is a 27 y.o. G1P0 with suspected molar pregnancy presenting for scheduled D&E.   Seen in MAU 10/15/22, [place] c/w suspected molar pregnancy. See note by Dr. Clement Husbands for details of her presentation.  This AM, doing well w/ no complaints.  Past Medical History:  Diagnosis Date   Seasonal allergies    Past Surgical History:  Procedure Laterality Date   TONSILLECTOMY     OB History  Gravida Para Term Preterm AB Living  1            SAB IAB Ectopic Multiple Live Births               # Outcome Date GA Lbr Len/2nd Weight Sex Delivery Anes PTL Lv  1 Gravida           Patient denies any other pertinent gynecologic issues.  No current facility-administered medications on file prior to encounter.   Current Outpatient Medications on File Prior to Encounter  Medication Sig Dispense Refill   metroNIDAZOLE (FLAGYL) 500 MG tablet Take 1 tablet (500 mg total) by mouth 2 (two) times daily for 13 doses. 13 tablet 0   acetaminophen (TYLENOL) 325 MG tablet Take 2 tablets (650 mg total) by mouth every 6 (six) hours as needed for moderate pain. 30 tablet 0   EPINEPHrine 0.3 mg/0.3 mL IJ SOAJ injection Inject 0.3 mg into the muscle as needed for anaphylaxis. 1 each 2   fluticasone (FLONASE) 50 MCG/ACT nasal spray Place into the nose. (Patient not taking: Reported on 12/19/2021)     predniSONE (DELTASONE) 20 MG tablet Take 60 mg daily x 2 days then 40 mg daily x 2 days then 20 mg daily x 2 days (Patient not taking: Reported on 12/19/2021) 12 tablet 0   Prenatal Vit-Fe Fumarate-FA (PRENATAL MULTIVITAMIN) TABS tablet Take 1 tablet by mouth daily at 12 noon.     Allergies  Allergen Reactions   Peanut-Containing Drug Products Anaphylaxis   Cucumber Extract    Other     tREE NUTS-    Social History:   reports that she has never smoked. She has never used smokeless tobacco. She reports current alcohol use. She  reports that she does not use drugs. Family History  Problem Relation Age of Onset   Healthy Mother    Hypertension Father    Asthma Brother    Allergic rhinitis Brother     Review of Systems: Pertinent items noted in HPI and remainder of comprehensive ROS otherwise negative.  PHYSICAL EXAM: Blood pressure (!) 145/84, pulse 98, temperature 98.5 F (36.9 C), resp. rate 19, height 5\' 6"  (1.676 m), weight 88.9 kg, last menstrual period 07/18/2022, SpO2 99 %, unknown if currently breastfeeding. CONSTITUTIONAL: Well-developed, well-nourished female in no acute distress.  HENT:  Normocephalic, atraumatic, External right and left ear normal.  EYES: Conjunctivae normal. No scleral icterus.  NECK: Normal range of motion SKIN: Skin is warm and dry. No rash noted. Not diaphoretic. No erythema. No pallor. NEUROLOGIC: Alert and oriented to person, place, and time.  PSYCHIATRIC: Normal mood and affect. Normal behavior. Normal judgment and thought content. CARDIOVASCULAR: Normal heart rate noted, regular rhythm RESPIRATORY: No problems with respiration noted ABDOMEN: Soft, gravid. Nontender PELVIC: Deferred  Labs: Results for orders placed or performed during the hospital encounter of 10/15/22 (from the past 336 hour(s))  GC/Chlamydia probe amp ([place])not at [facility]   Collection Time: 10/15/22 10:39 PM  Result Value Ref  Range   Neisseria Gonorrhea Negative    Chlamydia Negative    Comment Normal Reference Ranger Chlamydia - Negative    Comment      Normal Reference Range Neisseria Gonorrhea - Negative  Wet prep, genital   Collection Time: 10/15/22 10:52 PM   Specimen: PATH Cytology Cervicovaginal Ancillary Only  Result Value Ref Range   Yeast Wet Prep HPF POC NONE SEEN NONE SEEN   Trich, Wet Prep NONE SEEN NONE SEEN   Clue Cells Wet Prep HPF POC PRESENT (A) NONE SEEN   WBC, Wet Prep HPF POC <10 <10   Sperm NONE SEEN   Urinalysis, Routine w reflex microscopic PATH Cytology  Cervicovaginal Ancillary Only   Collection Time: 10/15/22 10:52 PM  Result Value Ref Range   Color, Urine STRAW (A) YELLOW   APPearance CLEAR CLEAR   Specific Gravity, Urine 1.010 1.005 - 1.030   pH 6.0 5.0 - 8.0   Glucose, UA NEGATIVE NEGATIVE mg/dL   Hgb urine dipstick NEGATIVE NEGATIVE   Bilirubin Urine NEGATIVE NEGATIVE   Ketones, ur NEGATIVE NEGATIVE mg/dL   Protein, ur NEGATIVE NEGATIVE mg/dL   Nitrite NEGATIVE NEGATIVE   Leukocytes,Ua NEGATIVE NEGATIVE  hCG, quantitative, pregnancy   Collection Time: 10/16/22  1:43 AM  Result Value Ref Range   hCG, Beta Chain, Quant, S >250,000 (H) <5 mIU/mL  CBC with Differential/Platelet   Collection Time: 10/16/22  1:43 AM  Result Value Ref Range   WBC 14.8 (H) 4.0 - 10.5 K/uL   RBC 3.31 (L) 3.87 - 5.11 MIL/uL   Hemoglobin 9.5 (L) 12.0 - 15.0 g/dL   HCT 22.0 (L) 25.4 - 27.0 %   MCV 84.9 80.0 - 100.0 fL   MCH 28.7 26.0 - 34.0 pg   MCHC 33.8 30.0 - 36.0 g/dL   RDW 62.3 76.2 - 83.1 %   Platelets 306 150 - 400 K/uL   nRBC 0.0 0.0 - 0.2 %   Neutrophils Relative % 65 %   Neutro Abs 9.5 (H) 1.7 - 7.7 K/uL   Lymphocytes Relative 24 %   Lymphs Abs 3.6 0.7 - 4.0 K/uL   Monocytes Relative 9 %   Monocytes Absolute 1.3 (H) 0.1 - 1.0 K/uL   Eosinophils Relative 1 %   Eosinophils Absolute 0.2 0.0 - 0.5 K/uL   Basophils Relative 0 %   Basophils Absolute 0.1 0.0 - 0.1 K/uL   Immature Granulocytes 1 %   Abs Immature Granulocytes 0.14 (H) 0.00 - 0.07 K/uL  ABO/Rh   Collection Time: 10/16/22  1:43 AM  Result Value Ref Range   ABO/RH(D) A POS    No rh immune globuloin      NOT A RH IMMUNE GLOBULIN CANDIDATE, PT RH POSITIVE Performed at [facility], [address] [place], [place] [address]   Results for orders placed or performed during the hospital encounter of 10/07/22 (from the past 336 hour(s))  Pregnancy, urine POC   Collection Time: 10/07/22  4:09 PM  Result Value Ref Range   Preg Test, Ur POSITIVE (A) NEGATIVE  Urinalysis,  Routine w reflex microscopic Urine, Clean Catch   Collection Time: 10/07/22  4:11 PM  Result Value Ref Range   Color, Urine YELLOW YELLOW   APPearance CLEAR CLEAR   Specific Gravity, Urine 1.015 1.005 - 1.030   pH 6.0 5.0 - 8.0   Glucose, UA NEGATIVE NEGATIVE mg/dL   Hgb urine dipstick NEGATIVE NEGATIVE   Bilirubin Urine NEGATIVE NEGATIVE   Ketones, ur  NEGATIVE NEGATIVE mg/dL   Protein, ur NEGATIVE NEGATIVE mg/dL   Nitrite NEGATIVE NEGATIVE   Leukocytes,Ua NEGATIVE NEGATIVE  GC/Chlamydia probe amp ([place])not at [facility]   Collection Time: 10/07/22  4:18 PM  Result Value Ref Range   Neisseria Gonorrhea Negative    Chlamydia Negative    Comment Normal Reference Ranger Chlamydia - Negative    Comment      Normal Reference Range Neisseria Gonorrhea - Negative  Wet prep, genital   Collection Time: 10/07/22  4:35 PM  Result Value Ref Range   Yeast Wet Prep HPF POC NONE SEEN NONE SEEN   Trich, Wet Prep NONE SEEN NONE SEEN   Clue Cells Wet Prep HPF POC NONE SEEN NONE SEEN   WBC, Wet Prep HPF POC <10 <10   Sperm NONE SEEN     Imaging Studies: US OB LESS THAN 14 WEEKS WITH OB TRANSVAGINAL  Result Date: 10/16/2022 CLINICAL DATA:  Vaginal bleeding, positive pregnancy test EXAM: OBSTETRIC <14 WK [place] AND TRANSVAGINAL OB US TECHNIQUE: Both transabdominal and transvaginal ultrasound examinations were performed for complete evaluation of the gestation as well as the maternal uterus, adnexal regions, and pelvic cul-de-sac. Transvaginal technique was performed to assess early pregnancy. COMPARISON:  None Available. FINDINGS: Intrauterine gestational sac: Absent Maternal uterus/adnexae: The uterus is significantly enlarged with enlarged prominent endometrium which is predominately hyperechoic with multiple cystic structures within most consistent with a molar pregnancy. This measures 13 cm in length, 7 cm in AP dimension and 6.3 cm in transverse dimension. The ovaries are within normal limits.  IMPRESSION: Hyperechoic multi-cystic structure within the uterus most consistent with a molar pregnancy. Critical Value/emergent results were called by telephone at the time of interpretation on 10/16/2022 at 1:38 am to Dr. Simmie Davies , who verbally acknowledged these results. Electronically Signed   By: Inez Catalina M.D.   On: 10/16/2022 01:38    Assessment: Suspected molar pregnancy  Plan: To OR for D&E Doxycycline 200mg  x1 for pre op abx Anticipate discharge home today  Gale Journey, [place], [facility] for [employer], [place]

## 2022-10-19 NOTE — Significant Event (Signed)
Called to see patient - she had been discharged from PACU, but when she was getting into her car vomited, felt dizzy and looked pale. She was brought back to PACU where she was HDS but reported persistent dizziness.   BP 100/64 (BP Location: Left Arm)   Pulse 92   Temp 98 F (36.7 C)   Resp 16   Ht 5\' 6"  (1.676 m)   Wt 88.9 kg   LMP 07/18/2022   SpO2 96%   Breastfeeding Unknown   BMI 31.64 kg/m  Exam: Gen: pale, diaphoretic, but conversant and oriented CV: RRR Pulm: respirations easy and regular Abd: soft, nontender. Uterus nonpalpable Pelvic: scant blood on pad, no bleeding with abdominal massage  A/P:  STAT CBC/BMP obtained -  - Hgb 9.4 > 6.6 post op (is s/p 1.2L IV fluid & 500cc pit w/ EBL 150cc) - BG 153 - Mild hyponatremia (133) No e/o active/inappropriate bleeding on exam Will transfuse 1u pRBCs for post op acute blood loss anemia.  If symptoms resolve and bleeding remains minimal, plan for dc from [place], [place], [facility] for [facility], [place]

## 2022-10-19 NOTE — Transfer of Care (Signed)
Immediate Anesthesia Transfer of Care Note  Patient: Financial controller  Procedure(s) Performed: DILATATION AND EVACUATION  Patient Location: PACU  Anesthesia Type:General  Level of Consciousness: awake, alert , and oriented  Airway & Oxygen Therapy: Patient Spontanous Breathing and Patient connected to nasal cannula oxygen  Post-op Assessment: Report given to RN and Post -op Vital signs reviewed and stable  Post vital signs: Reviewed and stable  Last Vitals:  Vitals Value Taken Time  BP 119/70 10/19/22 1006  Temp    Pulse 95 10/19/22 1007  Resp 16 10/19/22 1007  SpO2 92 % 10/19/22 1007  Vitals shown include unvalidated device data.  Last Pain:  Vitals:   10/19/22 0712  PainSc: 2          Complications: No notable events documented.

## 2022-10-19 NOTE — Op Note (Signed)
Preop Diagnosis: Molar pregnancy Postop Diagnosis: Same Procedure: Dilation and evacuation Surgeon: Dr. Gale Journey Assist: None Anesthesia: LMA  EBL: 150 cc IVF: 1200 cc  UOP: Not collected Complications: None  Findings: Normal anteverted uterus 16 week size. Bedside US with cystic POCs, no GS/YS/embryo. Thin stripe at end of case. Products of conception noted  Description of the procedure: Preop antibiotics of doxycycline given. Informed consent reviewed and signed. Pt given opportunity to ask questions.   Pt prepped and draped in the dorsal lithotomy fashion after LMA anesthesia found to be adequate. Timeout performed.   Open-sided speculum placed into the vagina. Cervix grasped with a ring forcep. Cervix progressively dilated to a 37 pratt.  I used a 10 and 12 mm rigid curette. Several passes done with the suction curette and the tissue was retrieved. A gentle pass was done with a 86mm curette. Additional gentle pass with Banjo curette. A gritty texture noted in all areas of the uterus and bedside ultrasound confirmed complete evacuation. Hemostasis adequate. Procedure completed. All instruments removed. Counts correct x2. Pitocin 666cc/h x 1 bag ordered for post op bleeding prevention.  Products of conception sent to pathology. Rh Positive - rhogam not indicated  Pt taken to recovery room in stable condition.   We discussed post operative contraception. She is undecided on method but was counseled on the importance of avoiding pregnancy until follow up of molar pregnancy is complete.  Gale Journey, MD [place], [facility] for [employer], [place]

## 2022-10-19 NOTE — Progress Notes (Signed)
Blood transfusion finished. Per Mikel Cella MD pt can be d/c if her vitals are stable and she feels better. Pt states "I feel better and am ready to go home."

## 2022-10-19 NOTE — Progress Notes (Signed)
CRITICAL RESULT PROVIDER NOTIFICATION  Test performed and critical result:  CBC  Date and time result received:  10/19/2022 @1158   Provider name/title: Gale Journey MD  Date and time provider notified:  10/19/2022 @1205   Date and time provider responded: 10/19/2022 @1208   Provider response:In department and See new orders

## 2022-10-19 NOTE — Anesthesia Postprocedure Evaluation (Signed)
Anesthesia Post Note  Patient: Financial controller  Procedure(s) Performed: DILATATION AND EVACUATION     Patient location during evaluation: PACU Anesthesia Type: General Level of consciousness: awake and alert, patient cooperative and oriented Pain management: pain level controlled Vital Signs Assessment: post-procedure vital signs reviewed and stable Respiratory status: spontaneous breathing, nonlabored ventilation and respiratory function stable Cardiovascular status: blood pressure returned to baseline and stable Postop Assessment: no apparent nausea or vomiting Anesthetic complications: no Comments: Pt attempted to D/C per PACU criteria, felt faint and nauseated. Nausea resolved with treatment, yet labs revealed Hb 6.6. Pt is much improved with transfusion. No apparent anesthetic complications   No notable events documented.  Last Vitals:  Vitals:   10/19/22 1345 10/19/22 1405  BP: 96/66 105/63  Pulse: 95 97  Resp: 16 16  Temp: 37 C 36.9 C  SpO2: 99% 100%    Last Pain:  Vitals:   10/19/22 1405  TempSrc: Oral  PainSc:                  Sameen Leas,E. Ji Fairburn

## 2022-10-19 NOTE — Anesthesia Procedure Notes (Signed)
Procedure Name: LMA Insertion Date/Time: 10/19/2022 9:04 AM  Performed by: Anastasio Auerbach, CRNAPre-anesthesia Checklist: Patient identified, Emergency Drugs available, Suction available and Patient being monitored Patient Re-evaluated:Patient Re-evaluated prior to induction Oxygen Delivery Method: Circle system utilized Preoxygenation: Pre-oxygenation with 100% oxygen Induction Type: IV induction Ventilation: Mask ventilation without difficulty LMA: LMA with gastric port inserted LMA Size: 4.0 Placement Confirmation: breath sounds checked- equal and bilateral and positive ETCO2 Tube secured with: Tape

## 2022-10-20 ENCOUNTER — Encounter (HOSPITAL_COMMUNITY): Payer: Self-pay | Admitting: Obstetrics and Gynecology

## 2022-10-21 ENCOUNTER — Inpatient Hospital Stay (HOSPITAL_COMMUNITY): Payer: 59

## 2022-10-21 ENCOUNTER — Ambulatory Visit: Payer: 59 | Admitting: Obstetrics

## 2022-10-21 ENCOUNTER — Inpatient Hospital Stay (HOSPITAL_BASED_OUTPATIENT_CLINIC_OR_DEPARTMENT_OTHER)
Admission: EM | Admit: 2022-10-21 | Discharge: 2022-10-22 | DRG: 812 | Disposition: A | Discharge status: Home / Self Care | Payer: 59 | Attending: Obstetrics and Gynecology | Admitting: Obstetrics and Gynecology

## 2022-10-21 ENCOUNTER — Encounter (HOSPITAL_BASED_OUTPATIENT_CLINIC_OR_DEPARTMENT_OTHER): Payer: Self-pay | Admitting: Emergency Medicine

## 2022-10-21 ENCOUNTER — Other Ambulatory Visit: Payer: Self-pay

## 2022-10-21 DIAGNOSIS — R519 Headache, unspecified: Secondary | ICD-10-CM

## 2022-10-21 DIAGNOSIS — D649 Anemia, unspecified: Secondary | ICD-10-CM | POA: Diagnosis not present

## 2022-10-21 DIAGNOSIS — Z3A00 Weeks of gestation of pregnancy not specified: Secondary | ICD-10-CM

## 2022-10-21 DIAGNOSIS — D62 Acute posthemorrhagic anemia: Principal | ICD-10-CM | POA: Diagnosis present

## 2022-10-21 DIAGNOSIS — Z20822 Contact with and (suspected) exposure to covid-19: Secondary | ICD-10-CM | POA: Diagnosis present

## 2022-10-21 DIAGNOSIS — O020 Blighted ovum and nonhydatidiform mole: Secondary | ICD-10-CM | POA: Diagnosis present

## 2022-10-21 LAB — CBC
HCT: 19.5 % — ABNORMAL LOW (ref 36.0–46.0)
Hemoglobin: 6.6 g/dL — CL (ref 12.0–15.0)
MCH: 29.1 pg (ref 26.0–34.0)
MCHC: 33.8 g/dL (ref 30.0–36.0)
MCV: 85.9 fL (ref 80.0–100.0)
Platelets: 267 10*3/uL (ref 150–400)
RBC: 2.27 MIL/uL — ABNORMAL LOW (ref 3.87–5.11)
RDW: 15.7 % — ABNORMAL HIGH (ref 11.5–15.5)
WBC: 18.7 10*3/uL — ABNORMAL HIGH (ref 4.0–10.5)
nRBC: 0 % (ref 0.0–0.2)

## 2022-10-21 LAB — COMPREHENSIVE METABOLIC PANEL
ALT: 24 U/L (ref 0–44)
AST: 28 U/L (ref 15–41)
Albumin: 2.9 g/dL — ABNORMAL LOW (ref 3.5–5.0)
Alkaline Phosphatase: 27 U/L — ABNORMAL LOW (ref 38–126)
Anion gap: 5 (ref 5–15)
BUN: 6 mg/dL (ref 6–20)
CO2: 25 mmol/L (ref 22–32)
Calcium: 8.1 mg/dL — ABNORMAL LOW (ref 8.9–10.3)
Chloride: 104 mmol/L (ref 98–111)
Creatinine, Ser: 0.6 mg/dL (ref 0.44–1.00)
GFR, Estimated: >60 mL/min (ref >60)
Glucose, Bld: 99 mg/dL (ref 70–99)
Potassium: 3.5 mmol/L (ref 3.5–5.1)
Sodium: 134 mmol/L — ABNORMAL LOW (ref 135–145)
Total Bilirubin: 0.2 mg/dL — ABNORMAL LOW (ref 0.3–1.2)
Total Protein: 5.9 g/dL — ABNORMAL LOW (ref 6.5–8.1)

## 2022-10-21 LAB — HCG, QUANTITATIVE, PREGNANCY: hCG, Beta Chain, Quant, S: 79493 m[IU]/mL — ABNORMAL HIGH (ref <5)

## 2022-10-21 LAB — CBC WITH DIFFERENTIAL/PLATELET
Abs Immature Granulocytes: 0.1 10*3/uL — ABNORMAL HIGH (ref 0.00–0.07)
Basophils Absolute: 0 10*3/uL (ref 0.0–0.1)
Basophils Relative: 0 %
Eosinophils Absolute: 0.1 10*3/uL (ref 0.0–0.5)
Eosinophils Relative: 1 %
HCT: 19.2 % — ABNORMAL LOW (ref 36.0–46.0)
Hemoglobin: 6.2 g/dL — CL (ref 12.0–15.0)
Immature Granulocytes: 1 %
Lymphocytes Relative: 27 %
Lymphs Abs: 4.4 10*3/uL — ABNORMAL HIGH (ref 0.7–4.0)
MCH: 27.9 pg (ref 26.0–34.0)
MCHC: 32.3 g/dL (ref 30.0–36.0)
MCV: 86.5 fL (ref 80.0–100.0)
Monocytes Absolute: 1.2 10*3/uL — ABNORMAL HIGH (ref 0.1–1.0)
Monocytes Relative: 7 %
Neutro Abs: 10.8 10*3/uL — ABNORMAL HIGH (ref 1.7–7.7)
Neutrophils Relative %: 64 %
Platelets: 254 10*3/uL (ref 150–400)
RBC: 2.22 MIL/uL — ABNORMAL LOW (ref 3.87–5.11)
RDW: 15.8 % — ABNORMAL HIGH (ref 11.5–15.5)
WBC: 16.6 10*3/uL — ABNORMAL HIGH (ref 4.0–10.5)
nRBC: 0 % (ref 0.0–0.2)

## 2022-10-21 LAB — URINALYSIS, ROUTINE W REFLEX MICROSCOPIC
Bilirubin Urine: NEGATIVE
Glucose, UA: NEGATIVE mg/dL
Ketones, ur: NEGATIVE mg/dL
Leukocytes,Ua: NEGATIVE
Nitrite: NEGATIVE
Protein, ur: NEGATIVE mg/dL
Specific Gravity, Urine: 1.015 (ref 1.005–1.030)
pH: 7.5 (ref 5.0–8.0)

## 2022-10-21 LAB — TYPE AND SCREEN
ABO/RH(D): A POS
Antibody Screen: NEGATIVE
Unit division: 0
Unit division: 0

## 2022-10-21 LAB — BPAM RBC
Blood Product Expiration Date: 202401142359
Blood Product Expiration Date: 202402102359
ISSUE DATE / TIME: 202401081342
Unit Type and Rh: 6200
Unit Type and Rh: 6200

## 2022-10-21 LAB — RESP PANEL BY RT-PCR (RSV, FLU A&B, COVID)  RVPGX2
Influenza A by PCR: NEGATIVE
Influenza B by PCR: NEGATIVE
Resp Syncytial Virus by PCR: NEGATIVE
SARS Coronavirus 2 by RT PCR: NEGATIVE

## 2022-10-21 LAB — SURGICAL PATHOLOGY

## 2022-10-21 LAB — PREPARE RBC (CROSSMATCH)

## 2022-10-21 LAB — URINALYSIS, MICROSCOPIC (REFLEX): Bacteria, UA: NONE SEEN

## 2022-10-21 MED ORDER — METRONIDAZOLE 500 MG PO TABS
500.0000 mg | ORAL_TABLET | Freq: Two times a day (BID) | ORAL | Status: DC
  Administered 2022-10-21 – 2022-10-22 (×2): 500 mg via ORAL
  Filled 2022-10-21 (×2): qty 1

## 2022-10-21 MED ORDER — IBUPROFEN 600 MG PO TABS: 600.0000 mg | ORAL_TABLET | Freq: Four times a day (QID) | ORAL | Status: DC | PRN

## 2022-10-21 MED ORDER — ACETAMINOPHEN 325 MG PO TABS
650.0000 mg | ORAL_TABLET | ORAL | Status: DC | PRN
  Administered 2022-10-21 – 2022-10-22 (×2): 650 mg via ORAL
  Filled 2022-10-21 (×2): qty 2

## 2022-10-21 MED ORDER — SODIUM CHLORIDE 0.9% IV SOLUTION: Freq: Once | INTRAVENOUS | Status: AC

## 2022-10-21 MED ORDER — KETOROLAC TROMETHAMINE 30 MG/ML IJ SOLN
15.0000 mg | Freq: Once | INTRAMUSCULAR | Status: AC
  Administered 2022-10-21: 15 mg via INTRAVENOUS
  Filled 2022-10-21: qty 1

## 2022-10-21 NOTE — ED Notes (Signed)
Pt aware of need for urine specimen, states went upon arrival, unable to provide at this time, will attempt again later

## 2022-10-21 NOTE — ED Provider Notes (Signed)
[facility] EMERGENCY DEPARTMENT Provider Note   CSN: [id number] Arrival date & time: 10/21/22  1024     History  Chief Complaint  Patient presents with   Headache    Tamara Rios is a 27 y.o. female.  With past medical history of recent molar pregnancy requiring D&E who presents to the emergency department with headache.  Patient states that she had a D&E on 10/19/2021 for molar pregnancy.  She states that afterward she had some blood loss anemia that required a single transfusion.  She states that they told her if she had a headache to present to the emergency department.  She states that she began having headache yesterday.  She called the OB GYN who told her to take some ibuprofen and if it did not improve, to present to the emergency department.  She states that she fell asleep and then this morning woke up with ongoing headache so presents.  She describes it as 7/10, sharp and throbbing on the right side.  She does have some associated blurry vision, photophobia.  She denies having any vomiting, URI symptoms, syncope, neck pain or fever.  She is also complaining of being cold.  No significant bleeding since her D&C.  She states that she used 1 pad yesterday and has not had to change her pad today.  Denies having any abdominal pain.  Headache      Home Medications Prior to Admission medications   Medication Sig Start Date End Date Taking? Authorizing Provider  acetaminophen (TYLENOL) 325 MG tablet Take 2 tablets (650 mg total) by mouth every 6 (six) hours as needed for moderate pain. 10/16/22  Yes Mercado-Ortiz, Jessica Q, DO  EPINEPHrine 0.3 mg/0.3 mL IJ SOAJ injection Inject 0.3 mg into the muscle as needed for anaphylaxis. 12/19/21  Yes Clemon Chambers, MD  metroNIDAZOLE (FLAGYL) 500 MG tablet Take 1 tablet (500 mg total) by mouth 2 (two) times daily for 13 doses. 10/16/22 10/23/22 Yes Mercado-Ortiz, Ernestine Conrad, DO      Allergies    Cucumber extract, Other,  Peanut-containing drug products, and Bee pollen    Review of Systems   Review of Systems  Neurological:  Positive for headaches.  All other systems reviewed and are negative.   Physical Exam Updated Vital Signs BP 121/74   Pulse 80   Temp 98.2 F (36.8 C)   Resp 16   Ht 5\' 6"  (1.676 m)   Wt 87.5 kg   LMP 07/18/2022   SpO2 99%   BMI 31.15 kg/m  Physical Exam Vitals and nursing note reviewed.  Constitutional:      General: She is not in acute distress.    Appearance: Normal appearance. She is well-developed. She is not ill-appearing or toxic-appearing.  HENT:     Head: Normocephalic.  Eyes:     General: No scleral icterus.    Extraocular Movements: Extraocular movements intact.     Right eye: Normal extraocular motion and no nystagmus.     Left eye: Normal extraocular motion and no nystagmus.     Pupils: Pupils are equal, round, and reactive to light.  Cardiovascular:     Rate and Rhythm: Normal rate and regular rhythm.     Heart sounds: Normal heart sounds. No murmur heard. Pulmonary:     Effort: Pulmonary effort is normal. No respiratory distress.     Breath sounds: Normal breath sounds.  Abdominal:     General: Bowel sounds are normal. There is no distension.  Palpations: Abdomen is soft.     Tenderness: There is no abdominal tenderness.  Musculoskeletal:     Cervical back: Normal range of motion and neck supple. No rigidity.  Skin:    General: Skin is warm and dry.     Capillary Refill: Capillary refill takes less than 2 seconds.  Neurological:     General: No focal deficit present.     Mental Status: She is alert and oriented to person, place, and time. Mental status is at baseline.  Psychiatric:        Mood and Affect: Mood normal.        Behavior: Behavior normal.     ED Results / Procedures / Treatments   Labs (all labs ordered are listed, but only abnormal results are displayed) Labs Reviewed  COMPREHENSIVE METABOLIC PANEL - Abnormal; Notable  for the following components:      Result Value   Sodium 134 (*)    Calcium 8.1 (*)    Total Protein 5.9 (*)    Albumin 2.9 (*)    Alkaline Phosphatase 27 (*)    Total Bilirubin 0.2 (*)    All other components within normal limits  CBC WITH DIFFERENTIAL/PLATELET - Abnormal; Notable for the following components:   WBC 16.6 (*)    RBC 2.22 (*)    Hemoglobin 6.2 (*)    HCT 19.2 (*)    RDW 15.8 (*)    Neutro Abs 10.8 (*)    Lymphs Abs 4.4 (*)    Monocytes Absolute 1.2 (*)    Abs Immature Granulocytes 0.10 (*)    All other components within normal limits  URINALYSIS, ROUTINE W REFLEX MICROSCOPIC - Abnormal; Notable for the following components:   Hgb urine dipstick MODERATE (*)    All other components within normal limits  HCG, QUANTITATIVE, PREGNANCY - Abnormal; Notable for the following components:   hCG, Beta Chain, Quant, S [id number] (*)    All other components within normal limits  RESP PANEL BY RT-PCR (RSV, FLU A&B, COVID)  [id number]  URINALYSIS, MICROSCOPIC (REFLEX)   EKG None  Radiology No results found.  Procedures Procedures   Medications Ordered in ED Medications - No data to display  ED Course/ Medical Decision Making/ A&P Clinical Course as of 10/21/22 1505  Wed Oct 21, 2022  1404 Consulted and spoke with Dr. Berton Lan, OB/GYN who recommends transfer to MAU for transfusion [LA]  1405 . [LA]    Clinical Course User Index [LA] Cristopher Peru, PA-C                           Medical Decision Making Amount and/or Complexity of Data Reviewed Labs: ordered.  Risk Decision regarding hospitalization.   27 year old female who presents to the emergency department with headache 2 days after having a D&E for molar pregnancy.  We obtained labs here including an hCG.  hCG is decreased from 6 days ago.  Notably she was found to be critically anemic to 6.2.  This may be causing her headache.  She is also feeling cold which is likely from her anemia.  She denies having any  new blood loss anemia.  Has only used 1 pad since yesterday.  She denies having any melena or hematochezia or hematemesis.  She denies having any abdominal pain or distention. I consulted and spoke with Dr. Berton Lan, who performed the D&E on Monday.  She states that the advisory for headache was generic in the  discharge paperwork .  Pressures not significantly elevated to concern for preeclampsia.  She does recommend transfer over to MAU for blood transfusion and ongoing observation.  Patient is agreeable to this. Patient is here with her significant other who is agreeable to taking her to MAU at this time.  Discussed that she should go directly to MAU and not stop anywhere.  She verbalized understanding. Final Clinical Impression(s) / ED Diagnoses Final diagnoses:  Symptomatic anemia  Acute nonintractable headache, unspecified headache type    Rx / DC Orders ED Discharge Orders     None         Mickie Hillier, PA-C 27/78/24 2353    Lianne Cure, DO 61/44/31 [phone]

## 2022-10-21 NOTE — ED Notes (Signed)
Call to report to MAU, instructed that pt to go to [facility] specialty care, spoke to [facility], charge nurse to provide report. Pt en route transported by family member, ambulating with steady gait.

## 2022-10-21 NOTE — ED Notes (Signed)
Pt to transfer to MAU via private vehicle approved by provider, accepting physician is [place].  IV site to remain in place for transport.

## 2022-10-21 NOTE — Plan of Care (Signed)
  Problem: Education: Goal: Knowledge of General Education information will improve Description: Including pain rating scale, medication(s)/side effects and non-pharmacologic comfort measures Outcome: Completed/Met   Problem: Activity: Goal: Risk for activity intolerance will decrease Outcome: Completed/Met   Problem: Nutrition: Goal: Adequate nutrition will be maintained Outcome: Completed/Met   Problem: Coping: Goal: Level of anxiety will decrease Outcome: Completed/Met   Problem: Elimination: Goal: Will not experience complications related to urinary retention Outcome: Completed/Met   Problem: Pain Managment: Goal: General experience of comfort will improve Outcome: Completed/Met   

## 2022-10-21 NOTE — H&P (Signed)
Tamara Rios is an 27 y.o. 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 ED for headache and anemia.   Pt is s/p D&E on 1/8 for molar pregnancy. EBL 150cc. Her post op course was complicated by symptomatic acute blood loss anemia for which she received 1u pRBCs. Her symptoms resolved and she was discharged home in good condition.   Today, Tamara Rios presented to the [facility] ED with persistent headache despite ibuprofen. She has associated blurred vision that she reports has been present since surgery. She was afebrile, HDS & normotensive with no focal neurologic deficit in the ED.  Labs: 16.6>6.2<254, CMP WNL, BhCG [id number]  She was transferred to [facility] for blood transfusion and further evaluation.   Here, she reports headache and blurred vision but is otherwise doing well. Denies heavy vaginal bleeding (using 1 pad/d) or abdominal pain. No f/c/n/v. Has normal appetite.  Menstrual History: Patient's last menstrual period was 07/18/2022.   Past Medical History:  Diagnosis Date   Seasonal allergies    Past Surgical History:  Procedure Laterality Date   DILATION AND EVACUATION N/A 10/19/2022   Procedure: DILATATION AND EVACUATION;  Surgeon: Inez Catalina, MD;  Location: [facility];  Service: Gynecology;  Laterality: N/A;   TONSILLECTOMY     Family History  Problem Relation Age of Onset   Healthy Mother    Hypertension Father    Asthma Brother    Allergic rhinitis Brother    Social History:  reports that she has never smoked. She has never used smokeless tobacco. She reports current alcohol use. She reports that she does not use drugs.  Allergies:  Allergies  Allergen Reactions   Cucumber Extract Itching and Other (See Comments)    Throat itches   Other Anaphylaxis, Hives and Other (See Comments)    TREE NUTS   Peanut-Containing Drug Products Anaphylaxis   Bee Pollen Other (See Comments)    Runny nose, itching, watery  eyes.   Medications Prior to Admission  Medication Sig Dispense Refill Last Dose   acetaminophen (TYLENOL) 325 MG tablet Take 2 tablets (650 mg total) by mouth every 6 (six) hours as needed for moderate pain. 30 tablet 0 Past Week   EPINEPHrine 0.3 mg/0.3 mL IJ SOAJ injection Inject 0.3 mg into the muscle as needed for anaphylaxis. 1 each 2 unknown   metroNIDAZOLE (FLAGYL) 500 MG tablet Take 1 tablet (500 mg total) by mouth 2 (two) times daily for 13 doses. 13 tablet 0 10/20/2022   Review of Systems  Constitutional:  Negative for appetite change, chills, diaphoresis and fever.  Gastrointestinal:  Negative for abdominal pain, nausea and vomiting.  Endocrine: Positive for cold intolerance.    Blood pressure 121/74, pulse 80, temperature 98.5 F (36.9 C), temperature source Oral, resp. rate 15, height 5\' 6"  (1.676 m), weight 87.5 kg, last menstrual period 07/18/2022, SpO2 100 %, unknown if currently breastfeeding.  Physical Exam Constitutional:      General: She is not in acute distress.    Appearance: She is not ill-appearing or diaphoretic.  Cardiovascular:     Rate and Rhythm: Normal rate and regular rhythm.  Pulmonary:     Effort: Pulmonary effort is normal. No respiratory distress.  Abdominal:     General: There is no distension.     Palpations: Abdomen is soft.     Tenderness: There is no abdominal tenderness.  Neurological:     Mental Status: She is alert and oriented  to person, place, and time.     Results for orders placed or performed during the hospital encounter of 10/21/22 (from the past 24 hour(s))  Resp panel by RT-PCR (RSV, Flu A&B, Covid) Nasopharyngeal Swab     Status: None   Collection Time: 10/21/22 11:27 AM   Specimen: Nasopharyngeal Swab; Nasal Swab  Result Value Ref Range   SARS Coronavirus 2 by RT PCR NEGATIVE NEGATIVE   Influenza A by PCR NEGATIVE NEGATIVE   Influenza B by PCR NEGATIVE NEGATIVE   Resp Syncytial Virus by PCR NEGATIVE NEGATIVE   Comprehensive metabolic panel     Status: Abnormal   Collection Time: 10/21/22 11:53 AM  Result Value Ref Range   Sodium 134 (L) 135 - 145 mmol/L   Potassium 3.5 3.5 - 5.1 mmol/L   Chloride 104 98 - 111 mmol/L   CO2 25 22 - 32 mmol/L   Glucose, Bld 99 70 - 99 mg/dL   BUN 6 6 - 20 mg/dL   Creatinine, Ser 7.89 0.44 - 1.00 mg/dL   Calcium 8.1 (L) 8.9 - 10.3 mg/dL   Total Protein 5.9 (L) 6.5 - 8.1 g/dL   Albumin 2.9 (L) 3.5 - 5.0 g/dL   AST 28 15 - 41 U/L   ALT 24 0 - 44 U/L   Alkaline Phosphatase 27 (L) 38 - 126 U/L   Total Bilirubin 0.2 (L) 0.3 - 1.2 mg/dL   GFR, Estimated >38 >10 mL/min   Anion gap 5 5 - 15  CBC with Differential     Status: Abnormal   Collection Time: 10/21/22 11:53 AM  Result Value Ref Range   WBC 16.6 (H) 4.0 - 10.5 K/uL   RBC 2.22 (L) 3.87 - 5.11 MIL/uL   Hemoglobin 6.2 (LL) 12.0 - 15.0 g/dL   HCT 17.5 (L) 10.2 - 58.5 %   MCV 86.5 80.0 - 100.0 fL   MCH 27.9 26.0 - 34.0 pg   MCHC 32.3 30.0 - 36.0 g/dL   RDW 27.7 (H) 82.4 - 23.5 %   Platelets 254 150 - 400 K/uL   nRBC 0.0 0.0 - 0.2 %   Neutrophils Relative % 64 %   Neutro Abs 10.8 (H) 1.7 - 7.7 K/uL   Lymphocytes Relative 27 %   Lymphs Abs 4.4 (H) 0.7 - 4.0 K/uL   Monocytes Relative 7 %   Monocytes Absolute 1.2 (H) 0.1 - 1.0 K/uL   Eosinophils Relative 1 %   Eosinophils Absolute 0.1 0.0 - 0.5 K/uL   Basophils Relative 0 %   Basophils Absolute 0.0 0.0 - 0.1 K/uL   Immature Granulocytes 1 %   Abs Immature Granulocytes 0.10 (H) 0.00 - 0.07 K/uL  hCG, quantitative, pregnancy     Status: Abnormal   Collection Time: 10/21/22 11:53 AM  Result Value Ref Range   hCG, Beta Chain, Quant, S 79,493 (H) <5 mIU/mL  Urinalysis, Routine w reflex microscopic Urine, Clean Catch     Status: Abnormal   Collection Time: 10/21/22 12:50 PM  Result Value Ref Range   Color, Urine YELLOW YELLOW   APPearance CLEAR CLEAR   Specific Gravity, Urine 1.015 1.005 - 1.030   pH 7.5 5.0 - 8.0   Glucose, UA NEGATIVE NEGATIVE  mg/dL   Hgb urine dipstick MODERATE (A) NEGATIVE   Bilirubin Urine NEGATIVE NEGATIVE   Ketones, ur NEGATIVE NEGATIVE mg/dL   Protein, ur NEGATIVE NEGATIVE mg/dL   Nitrite NEGATIVE NEGATIVE   Leukocytes,Ua NEGATIVE NEGATIVE  Urinalysis, Microscopic (reflex)  Status: None   Collection Time: 10/21/22 12:50 PM  Result Value Ref Range   RBC / HPF 11-20 0 - 5 RBC/hpf   WBC, UA 0-5 0 - 5 WBC/hpf   Bacteria, UA NONE SEEN NONE SEEN   Squamous Epithelial / HPF 0-5 0 - 5 /HPF   Assessment/Plan: 27 y.o. G1P0010 who is POD2 s/p D&E for complete molar pregnancy a/f symptomatic anemia  Post operative anemia from acute blood loss - no e/o ongoing blood loss. Pelvic US obtained, discussed w/ tech who verbally reports no e/o rPOCs & my review of images w/ thin endometrial stripe. F/u final read - transfuse 1u pRBCs - AM CBC  2. Headache - No FND, no indication for head imaging at this time - Tylenol/ibuprofen prn - Suspect 2/2 anemia, will f/u symptoms after transfusion  3. Complete molar pregnancy - Patient counseled on pathology results. Reviewed risk of malignancy (15-20%) and need for close interval follow up. Discussed weekly BhCG until negative (<5), then monthly x 3 months - Pt considering birth control methods  Inez Catalina 10/21/2022, 4:03 PM

## 2022-10-21 NOTE — ED Triage Notes (Signed)
Headache and right shoulder pain , had DE 10/19/2022 , also had blood transfusion same day / reports possible reaction to it .  Also reports palpitation with minimal exertion . Alert and oriented x 4 . Ambulatory to triage room with steady gait .

## 2022-10-21 NOTE — ED Notes (Addendum)
Pt requesting toradol for headache, and request to transfer to [facility] via private vehicle once arrangements made.  Provider made aware.

## 2022-10-22 DIAGNOSIS — D62 Acute posthemorrhagic anemia: Principal | ICD-10-CM

## 2022-10-22 LAB — CBC
HCT: 20.4 % — ABNORMAL LOW (ref 36.0–46.0)
HCT: 24.9 % — ABNORMAL LOW (ref 36.0–46.0)
Hemoglobin: 7 g/dL — ABNORMAL LOW (ref 12.0–15.0)
Hemoglobin: 8.7 g/dL — ABNORMAL LOW (ref 12.0–15.0)
MCH: 29.5 pg (ref 26.0–34.0)
MCH: 29.8 pg (ref 26.0–34.0)
MCHC: 34.3 g/dL (ref 30.0–36.0)
MCHC: 34.9 g/dL (ref 30.0–36.0)
MCV: 86.1 fL (ref 80.0–100.0)
MCV: 85.3 fL (ref 80.0–100.0)
Platelets: 230 10*3/uL (ref 150–400)
Platelets: 240 10*3/uL (ref 150–400)
RBC: 2.37 MIL/uL — ABNORMAL LOW (ref 3.87–5.11)
RBC: 2.92 MIL/uL — ABNORMAL LOW (ref 3.87–5.11)
RDW: 15.2 % (ref 11.5–15.5)
RDW: 14.7 % (ref 11.5–15.5)
WBC: 15.2 10*3/uL — ABNORMAL HIGH (ref 4.0–10.5)
WBC: 13.1 10*3/uL — ABNORMAL HIGH (ref 4.0–10.5)
nRBC: 0.1 % (ref 0.0–0.2)
nRBC: 0 % (ref 0.0–0.2)

## 2022-10-22 LAB — PREPARE RBC (CROSSMATCH)

## 2022-10-22 MED ORDER — IBUPROFEN 600 MG PO TABS: 600.0000 mg | ORAL_TABLET | Freq: Four times a day (QID) | ORAL | 0 refills | Status: AC | PRN

## 2022-10-22 MED ORDER — SODIUM CHLORIDE 0.9% IV SOLUTION: Freq: Once | INTRAVENOUS | Status: AC

## 2022-10-22 MED ORDER — FERROUS SULFATE 325 (65 FE) MG PO TABS: 325.0000 mg | ORAL_TABLET | ORAL | 3 refills | Status: AC

## 2022-10-22 NOTE — Progress Notes (Signed)
   10/22/22 1816  Departure Condition  Departure Condition Good  Mobility at [facility]  Patient/Caregiver Teaching Teach Back Method Used;Discharge instructions reviewed;Prescriptions reviewed;Follow-up care reviewed;Patient/caregiver verbalized understanding;Pain management discussed;Medications discussed  Departure Mode With significant other  Was procedural sedation performed on this patient during this visit? No   Patient alert and oriented x4, and VS and pain stable.

## 2022-10-22 NOTE — Progress Notes (Signed)
Gynecology Progress Note  Admission Date: 10/21/2022 Current Date: 10/22/2022 9:26 AM  Tamara Rios is a 27 y.o. G1P0 HD#2 admitted for symptomatic anemia s/p D and E for molar pregnancy   History complicated by: Patient Active Problem List   Diagnosis Date Noted   Acute blood loss as cause of postoperative anemia 10/21/2022   Symptomatic anemia 10/21/2022   Complete molar pregnancy 10/19/2022    ROS and patient/family/surgical history, located on admission H&P note dated 10/21/2022, have been reviewed, and there are no changes except as noted below Yesterday/Overnight Events:  Transfusion x 1 unit  Subjective:  Pt overall feels better this morning.  She is not as fatigued, but still has a headache.  Pt notes that the blurred vision has resolved.  She denies having any significant vaginal bleeding.  Objective:   Vitals:   10/21/22 1815 10/21/22 2038 10/21/22 2332 10/22/22 0346  BP: 120/71 103/61 116/75 110/84  Pulse: (!) 106 98 97 (!) 115  Resp: 16 18 17 19   Temp: 98.6 F (37 C) 98.7 F (37.1 C)  98.7 F (37.1 C)  TempSrc: Axillary Oral  Oral  SpO2: 100% 100% 100% 99%  Weight:      Height:        Temp:  [98.1 F (36.7 C)-98.7 F (37.1 C)] 98.7 F (37.1 C) (01/11 0346) Pulse Rate:  [25-115] 115 (01/11 0346) Resp:  [15-19] 19 (01/11 0346) BP: (103-126)/(58-88) 110/84 (01/11 0346) SpO2:  [85 %-100 %] 99 % (01/11 0346) Weight:  [87.5 kg] 87.5 kg (01/10 1103) I/O last 3 completed shifts: In: 395 [Blood:395] Out: 200 [Urine:200] No intake/output data recorded.  Intake/Output Summary (Last 24 hours) at 10/22/2022 0926 Last data filed at 10/21/2022 2038 Gross per 24 hour  Intake 395 ml  Output 200 ml  Net 195 ml     Current Vital Signs 24h Vital Sign Ranges  T 98.7 F (37.1 C) Temp  Avg: 98.4 F (36.9 C)  Min: 98.1 F (36.7 C)  Max: 98.7 F (37.1 C)  BP 110/84 BP  Min: 103/61  Max: 126/69  HR (!) 115 Pulse  Avg: 88.5  Min: 25  Max: 115  RR 19 Resp  Avg:  16.9  Min: 15  Max: 19  SaO2 99 %  (room air) SpO2  Avg: 98.6 %  Min: 85 %  Max: 100 %       24 Hour I/O Current Shift I/O  Time Ins Outs 01/10 0701 - 01/11 0700 In: 395  Out: 200 [Urine:200] No intake/output data recorded.   Patient Vitals for the past 12 hrs:  BP Temp Temp src Pulse Resp SpO2  10/22/22 0346 110/84 98.7 F (37.1 C) Oral (!) 115 19 99 %  10/21/22 2332 116/75 -- -- 97 17 100 %     Patient Vitals for the past 24 hrs:  BP Temp Temp src Pulse Resp SpO2 Height Weight  10/22/22 0346 110/84 98.7 F (37.1 C) Oral (!) 115 19 99 % -- --  10/21/22 2332 116/75 -- -- 97 17 100 % -- --  10/21/22 2038 103/61 98.7 F (37.1 C) Oral 98 18 100 % -- --  10/21/22 1815 120/71 98.6 F (37 C) Axillary (!) 106 16 100 % -- --  10/21/22 1750 -- -- -- -- -- 100 % -- --  10/21/22 1748 (!) 114/58 98.2 F (36.8 C) Oral (!) 103 16 100 % -- --  10/21/22 1555 126/69 -- -- 89 -- 100 % -- --  10/21/22  1554 -- 98.5 F (36.9 C) Oral -- 15 100 % -- --  10/21/22 1459 121/74 98.2 F (36.8 C) -- 80 16 99 % -- --  10/21/22 1436 126/82 98.3 F (36.8 C) -- 84 16 100 % -- --  10/21/22 1435 -- -- -- (!) 25 -- (!) 85 % -- --  10/21/22 1249 118/74 -- -- 88 18 99 % -- --  10/21/22 1103 -- -- -- -- -- -- 5\' 6"  (1.676 m) 87.5 kg  10/21/22 1102 125/88 98.1 F (36.7 C) Oral 89 18 100 % -- --    Physical exam: General appearance: alert, cooperative, appears stated age, and no distress Abdomen: soft, non-tender; bowel sounds normal; no masses,  no organomegaly GU: No gross VB Lungs: clear to auscultation bilaterally Heart: regular rate and rhythm Extremities: no lower extremity edema, no calf pain, negative holmann's sign Skin: WNL Psych: appropriate Neurologic: Grossly normal  Medications Current Facility-Administered Medications  Medication Dose Route Frequency Provider Last Rate Last Admin   0.9 %  sodium chloride infusion (Manually program via Guardrails IV Fluids)   Intravenous Once Griffin Basil, MD       acetaminophen (TYLENOL) tablet 650 mg  650 mg Oral Q4H PRN Inez Catalina, MD   650 mg at 10/21/22 1634   ibuprofen (ADVIL) tablet 600 mg  600 mg Oral Q6H PRN Inez Catalina, MD       metroNIDAZOLE (FLAGYL) tablet 500 mg  500 mg Oral BID Gale Journey C, MD   500 mg at 10/21/22 2101      Labs  Recent Labs  Lab 10/21/22 1153 10/21/22 1627 10/22/22 0422  WBC 16.6* 18.7* 15.2*  HGB 6.2* 6.6* 7.0*  HCT 19.2* 19.5* 20.4*  PLT 254 267 230    Recent Labs  Lab 10/19/22 1126 10/21/22 1153  NA 133* 134*  K 3.6 3.5  CL 105 104  CO2 23 25  BUN 7 6  CREATININE 0.96 0.60  CALCIUM 7.8* 8.1*  PROT  --  5.9*  BILITOT  --  0.2*  ALKPHOS  --  27*  ALT  --  24  AST  --  28  GLUCOSE 165* 99    Radiology N/a  Assessment & Plan:  HD 2:  s/p transfusion 2/2 symptomatic anemia *GYN: POD 3 from D and E, pt still slightly symptomatic with sustained headache.  Will give tylenol and transfuse 1 unit of PRBC *Pain: none *FEN/GI: pt toleraing regular diet *Dispo: anticipate discharge home today after transfusion is complete  Code Status: Full Code   Lynnda Shields, MD Attending Center for [facility])

## 2022-10-22 NOTE — Plan of Care (Signed)
  Problem: Health Behavior/Discharge Planning: Goal: Ability to manage health-related needs will improve Outcome: Adequate for Discharge   Problem: Clinical Measurements: Goal: Ability to maintain clinical measurements within normal limits will improve Outcome: Adequate for Discharge Goal: Will remain free from infection Outcome: Adequate for Discharge Goal: Diagnostic test results will improve Outcome: Adequate for Discharge Goal: Respiratory complications will improve Outcome: Adequate for Discharge Goal: Cardiovascular complication will be avoided Outcome: Adequate for Discharge   Problem: Elimination: Goal: Will not experience complications related to bowel motility Outcome: Adequate for Discharge   Problem: Safety: Goal: Ability to remain free from injury will improve Outcome: Adequate for Discharge   Problem: Skin Integrity: Goal: Risk for impaired skin integrity will decrease Outcome: Adequate for Discharge   

## 2022-10-22 NOTE — Discharge Summary (Signed)
Physician Discharge Summary  Patient ID: Tamara Rios MRN: [id number] DOB/AGE: Feb 28, 1996 27 y.o.  Admit date: 10/21/2022 Discharge date: 10/22/2022  Admission Diagnoses: Acute blood loss as cause of postoperative anemia  Discharge Diagnoses:  Principal Problem:   Acute blood loss as cause of postoperative anemia Active Problems:   Complete molar pregnancy   Symptomatic anemia   Discharged Condition: good  Hospital Course: Pt was admitted 10/21/22 with moderate headache and anemia.  Pt had D and E on 10/19/22.  She had symptomatic anemia immediately and received 1 unit of PRBC.  Pt initially did well, but then returned to [facility] ED with headache.  Evaluation showed a hemoglobin of 6.2  Pt was transferred to [facility] for blood transfusion.  She initially received 1 unit PRBC, but she still had headache this AM.  Pt received tylenol and another unit of PRBC.    Hemoglobin increased to 8.7  and headache was resolved.  Pt was discharged in the afternoon and will continue oral iron.  Consults: None  Significant Diagnostic Studies: labs: cbc  Treatments: blood transfusion  Discharge Exam: Blood pressure 107/68, pulse 75, temperature 99.4 F (37.4 C), temperature source Oral, resp. rate 18, height 5\' 6"  (1.676 m), weight 87.5 kg, last menstrual period 07/18/2022, SpO2 99 %, unknown if currently breastfeeding. General appearance: alert, cooperative, and no distress Resp: clear to auscultation bilaterally Cardio: regular rate and rhythm GI: soft, non-tender; bowel sounds normal; no masses,  no organomegaly Extremities: extremities normal, atraumatic, no cyanosis or edema and Homans sign is negative, no sign of DVT Neurologic: Grossly normal  Disposition: Discharge disposition: 01-Home or Self Care       Discharge Instructions     Call MD for:  difficulty breathing, headache or visual disturbances   Complete by: As directed    Call MD for:  persistant dizziness or  light-headedness   Complete by: As directed    Call MD for:  persistant nausea and vomiting   Complete by: As directed    Call MD for:  severe uncontrolled pain   Complete by: As directed    Call MD for:  temperature >100.4   Complete by: As directed    Diet general   Complete by: As directed    Increase activity slowly   Complete by: As directed    Sexual Activity Restrictions   Complete by: As directed    Pelvic rest 2-3 weeks      Allergies as of 10/22/2022       Reactions   Cucumber Extract Itching, Other (See Comments)   Throat itches   Other Anaphylaxis, Hives, Other (See Comments)   TREE NUTS   Peanut-containing Drug Products Anaphylaxis   Bee Pollen Other (See Comments)   Runny nose, itching, watery eyes.        Medication List     TAKE these medications    acetaminophen 325 MG tablet Commonly known as: Tylenol Take 2 tablets (650 mg total) by mouth every 6 (six) hours as needed for moderate pain.   EPINEPHrine 0.3 mg/0.3 mL Soaj injection Commonly known as: EPI-PEN Inject 0.3 mg into the muscle as needed for anaphylaxis.   ferrous sulfate 325 (65 FE) MG tablet Commonly known as: FerrouSul Take 1 tablet (325 mg total) by mouth every other day.   ibuprofen 600 MG tablet Commonly known as: ADVIL Take 1 tablet (600 mg total) by mouth every 6 (six) hours as needed for headache.   metroNIDAZOLE 500 MG tablet Commonly  known as: Flagyl Take 1 tablet (500 mg total) by mouth 2 (two) times daily for 13 doses.        Follow-up Information     [facility] Follow up.   Specialty: Obstetrics and Gynecology Why: hospital follow up/ surgical post op in 2-3 weeks with [facility] information: [address], [place]                Signed: Griffin Basil 10/22/2022, 5:27 PM

## 2022-10-23 LAB — BPAM RBC
Blood Product Expiration Date: 202401312359
Blood Product Expiration Date: 202402102359
ISSUE DATE / TIME: 202401101750
ISSUE DATE / TIME: 202401111006
Unit Type and Rh: 6200
Unit Type and Rh: 6200

## 2022-10-23 LAB — TYPE AND SCREEN
ABO/RH(D): A POS
Antibody Screen: NEGATIVE
Unit division: 0
Unit division: 0

## 2022-10-26 ENCOUNTER — Ambulatory Visit (INDEPENDENT_AMBULATORY_CARE_PROVIDER_SITE_OTHER): Payer: 59 | Admitting: Emergency Medicine

## 2022-10-26 VITALS — BP 122/81 | HR 83 | Wt 183.3 lb

## 2022-10-26 DIAGNOSIS — O020 Blighted ovum and nonhydatidiform mole: Secondary | ICD-10-CM

## 2022-10-26 DIAGNOSIS — Z3A00 Weeks of gestation of pregnancy not specified: Secondary | ICD-10-CM

## 2022-10-26 NOTE — Progress Notes (Signed)
Patient presents for repeat Bhcg. Had D&E on 1/8 for molar pregnancy. Pt is well appearing and reports no symptoms today.  Reports light spotting and mild cramping, denies heavy vaginal bleeding.  Counseled on PO iron intake. BHCG drawn today. Next apt is 1/22 for repeat bhcg.

## 2022-10-27 LAB — BETA HCG QUANT (REF LAB): hCG Quant: 8837 m[IU]/mL

## 2022-11-02 ENCOUNTER — Ambulatory Visit: Payer: 59

## 2022-11-02 DIAGNOSIS — O020 Blighted ovum and nonhydatidiform mole: Secondary | ICD-10-CM

## 2022-11-02 NOTE — Progress Notes (Addendum)
SUBJECTIVE 27 y.o GYN Pt presents for repeat Bhcg.  Had D&E on 10/19/22 for molar pregnancy.  Pt is well appearing and reports light spotting.  Pt denies pain, fever, chills or heavy bleeding.  PLAN BHcg drawn today.  Will follow up with Pt when the results are received.

## 2022-11-03 ENCOUNTER — Encounter: Payer: Self-pay | Admitting: Obstetrics and Gynecology

## 2022-11-03 LAB — BETA HCG QUANT (REF LAB): hCG Quant: 1417 m[IU]/mL

## 2022-11-04 ENCOUNTER — Telehealth: Payer: Self-pay | Admitting: Emergency Medicine

## 2022-11-04 NOTE — Telephone Encounter (Signed)
-----  Message from Mora Bellman, MD sent at 11/03/2022  9:21 AM EST ----- Please inform patient of continued decrease in quant HCG level. Patient needs to continue weekly quant HCG

## 2022-11-04 NOTE — Telephone Encounter (Signed)
Attempted TC to patient, LVM. 

## 2022-11-05 ENCOUNTER — Telehealth: Payer: Self-pay | Admitting: Emergency Medicine

## 2022-11-05 NOTE — Telephone Encounter (Signed)
Discussed results with pt.  Scheduled for next beta HCG. Pt requests OCPs, Mikel Cella, MD, made aware.

## 2022-11-09 ENCOUNTER — Other Ambulatory Visit: Payer: 59

## 2022-11-09 DIAGNOSIS — O020 Blighted ovum and nonhydatidiform mole: Secondary | ICD-10-CM

## 2022-11-10 LAB — BETA HCG QUANT (REF LAB): hCG Quant: 318 m[IU]/mL

## 2022-11-12 MED ORDER — JUNEL FE 24 1-20 MG-MCG(24) PO TABS: 1.0000 | ORAL_TABLET | Freq: Every day | ORAL | 11 refills | Status: AC

## 2022-11-17 ENCOUNTER — Encounter: Payer: Self-pay | Admitting: Obstetrics and Gynecology

## 2022-11-17 ENCOUNTER — Ambulatory Visit (INDEPENDENT_AMBULATORY_CARE_PROVIDER_SITE_OTHER): Payer: 59 | Admitting: Obstetrics and Gynecology

## 2022-11-17 VITALS — BP 136/77 | HR 76 | Wt 187.0 lb

## 2022-11-17 DIAGNOSIS — Z9889 Other specified postprocedural states: Secondary | ICD-10-CM

## 2022-11-17 DIAGNOSIS — O020 Blighted ovum and nonhydatidiform mole: Secondary | ICD-10-CM

## 2022-11-17 NOTE — Progress Notes (Signed)
Pt states she has not had repeat Quant this week. Pt complains of blurry vision, has not yet resolved. Pt is taking Iron supplement and will pick up OCP at pharmacy. Pt has had intercourse since procedure, no protection.

## 2022-11-17 NOTE — Progress Notes (Signed)
   RETURN GYNECOLOGY VISIT  Subjective:  Tamara Rios is a 27 y.o. G1P0010 who is 4 weeks s/p D&E for complete molar pregnancy presenting for follow up. Her post op course was c/b symptomatic anemia requiring readmission for transfusion. She received a total of 3u pRBCs (1 immediate post op & 2 with re-admission).  Reports pain has resolved. No further bleeding. No lightheadedness/dizziness. Notes persistent decrease in visual acuity/ability to see long distances compared to prior to surgery.   Has been sexually active since her surgery (yesterday). Used withdrawal method, has not started COCs.   She is planning to move to [facility] around May.  HCG trend:  1/5: >250k  1/10: 78295 1/15: 8837 1/22: 1417 1/29: 318  Objective:   Vitals:   11/17/22 1548  BP: 136/77  Pulse: 76  Weight: 187 lb (84.8 kg)   General:  Alert, oriented and cooperative. Patient is in no acute distress.  Skin: Skin is warm and dry. No rash noted.   Cardiovascular: Normal heart rate noted  Respiratory: Normal respiratory effort, no problems with respiration noted  Abdomen: Soft, nontender    Assessment and Plan:  Tamara Rios is a 27 y.o. with complete molar pregnancy  Complete molar pregnancy Recovering well s/p D&E Appropriately declining BhCG. Due for level today Plan for weekly BhCG until <5, then monthly x 3 months Discussed importance of good contraception until surveillance is completed. She will start COCs today. Discussed back up method x 7d. Will complete her surveillance at the [facility] office for convenience  2. Anemia Continue iron supplement  3. Decreased visual acuity Pt will schedule eye exam  Return in about 1 week (around 11/24/2022) for blood draw - at [place] office.  Inez Catalina, MD

## 2022-11-17 NOTE — Addendum Note (Signed)
Addended by: Gale Journey on: 11/17/2022 05:09 PM   Modules accepted: Orders

## 2022-11-18 LAB — BETA HCG QUANT (REF LAB): hCG Quant: 108 m[IU]/mL

## 2022-11-23 ENCOUNTER — Ambulatory Visit (INDEPENDENT_AMBULATORY_CARE_PROVIDER_SITE_OTHER): Payer: 59

## 2022-11-23 VITALS — BP 122/71 | HR 68 | Wt 188.0 lb

## 2022-11-23 DIAGNOSIS — O020 Blighted ovum and nonhydatidiform mole: Secondary | ICD-10-CM

## 2022-11-23 NOTE — Progress Notes (Signed)
Patient presents for Beta HCG. Patient was sent to the lab to have lab drawn.  Tamara Rios l Tamara Rios, CMA

## 2022-11-24 ENCOUNTER — Other Ambulatory Visit: Payer: Self-pay | Admitting: Obstetrics and Gynecology

## 2022-11-24 DIAGNOSIS — O020 Blighted ovum and nonhydatidiform mole: Secondary | ICD-10-CM

## 2022-11-24 LAB — BETA HCG QUANT (REF LAB): hCG Quant: 60 m[IU]/mL

## 2022-12-02 ENCOUNTER — Ambulatory Visit: Payer: 59 | Admitting: Family Medicine

## 2022-12-03 ENCOUNTER — Ambulatory Visit: Payer: 59

## 2022-12-04 ENCOUNTER — Ambulatory Visit: Payer: 59

## 2022-12-04 DIAGNOSIS — O020 Blighted ovum and nonhydatidiform mole: Secondary | ICD-10-CM

## 2022-12-04 DIAGNOSIS — Z8759 Personal history of other complications of pregnancy, childbirth and the puerperium: Secondary | ICD-10-CM

## 2022-12-04 NOTE — Progress Notes (Signed)
Patient presents for repeat Beta hCG Quant. Patient was sent to the lab to have lab drawn. Tamara Rios, CMA

## 2022-12-05 LAB — BETA HCG QUANT (REF LAB): hCG Quant: 24 m[IU]/mL

## 2022-12-07 ENCOUNTER — Other Ambulatory Visit: Payer: Self-pay | Admitting: Obstetrics and Gynecology

## 2022-12-07 DIAGNOSIS — O020 Blighted ovum and nonhydatidiform mole: Secondary | ICD-10-CM

## 2022-12-08 ENCOUNTER — Telehealth: Payer: Self-pay

## 2022-12-08 NOTE — Telephone Encounter (Signed)
Attempted to reach patient to reschedule another HCG blood draw (molar pregnancy- needs weekly until 0)   Went to voicemail- left message for patient to return call to office. Kathrene Alu RN

## 2022-12-10 ENCOUNTER — Ambulatory Visit: Payer: 59

## 2022-12-10 DIAGNOSIS — O020 Blighted ovum and nonhydatidiform mole: Secondary | ICD-10-CM

## 2022-12-10 NOTE — Progress Notes (Signed)
Patient sent to lab for blood draw. Kathrene Alu RN

## 2022-12-11 LAB — BETA HCG QUANT (REF LAB): hCG Quant: 16 m[IU]/mL

## 2022-12-17 ENCOUNTER — Ambulatory Visit: Payer: 59

## 2022-12-17 DIAGNOSIS — O020 Blighted ovum and nonhydatidiform mole: Secondary | ICD-10-CM

## 2022-12-17 NOTE — Progress Notes (Signed)
Patient sent to lab for repeat HCG (molar pregnancy). Kathrene Alu RN

## 2022-12-18 LAB — BETA HCG QUANT (REF LAB): hCG Quant: 10 m[IU]/mL

## 2022-12-24 ENCOUNTER — Ambulatory Visit: Payer: 59

## 2022-12-24 ENCOUNTER — Telehealth: Payer: Self-pay

## 2022-12-24 DIAGNOSIS — O020 Blighted ovum and nonhydatidiform mole: Secondary | ICD-10-CM

## 2022-12-24 NOTE — Telephone Encounter (Signed)
Patient called and states she will come today and get her follow up HCG drawn. Kathrene Alu RN

## 2022-12-24 NOTE — Progress Notes (Signed)
Patient sent to lab for blood draw. Follow up molar pregnancy. Kathrene Alu RN

## 2022-12-25 LAB — BETA HCG QUANT (REF LAB): hCG Quant: 6 m[IU]/mL

## 2023-01-04 ENCOUNTER — Ambulatory Visit: Payer: BC Managed Care – PPO

## 2023-01-04 DIAGNOSIS — O020 Blighted ovum and nonhydatidiform mole: Secondary | ICD-10-CM

## 2023-01-05 ENCOUNTER — Other Ambulatory Visit: Payer: Self-pay | Admitting: Obstetrics and Gynecology

## 2023-01-05 DIAGNOSIS — O020 Blighted ovum and nonhydatidiform mole: Secondary | ICD-10-CM

## 2023-01-05 LAB — BETA HCG QUANT (REF LAB): hCG Quant: 4 m[IU]/mL

## 2023-01-21 ENCOUNTER — Ambulatory Visit: Payer: 59

## 2023-01-25 ENCOUNTER — Encounter: Payer: Self-pay | Admitting: *Deleted

## 2023-01-25 IMAGING — DX DG CHEST 1V PORT
1 series · 1 of 1 positions shown · non-contrast
Comparison: None.

CLINICAL DATA: Chest pain

EXAM:
PORTABLE CHEST 1 VIEW

[chest ap]
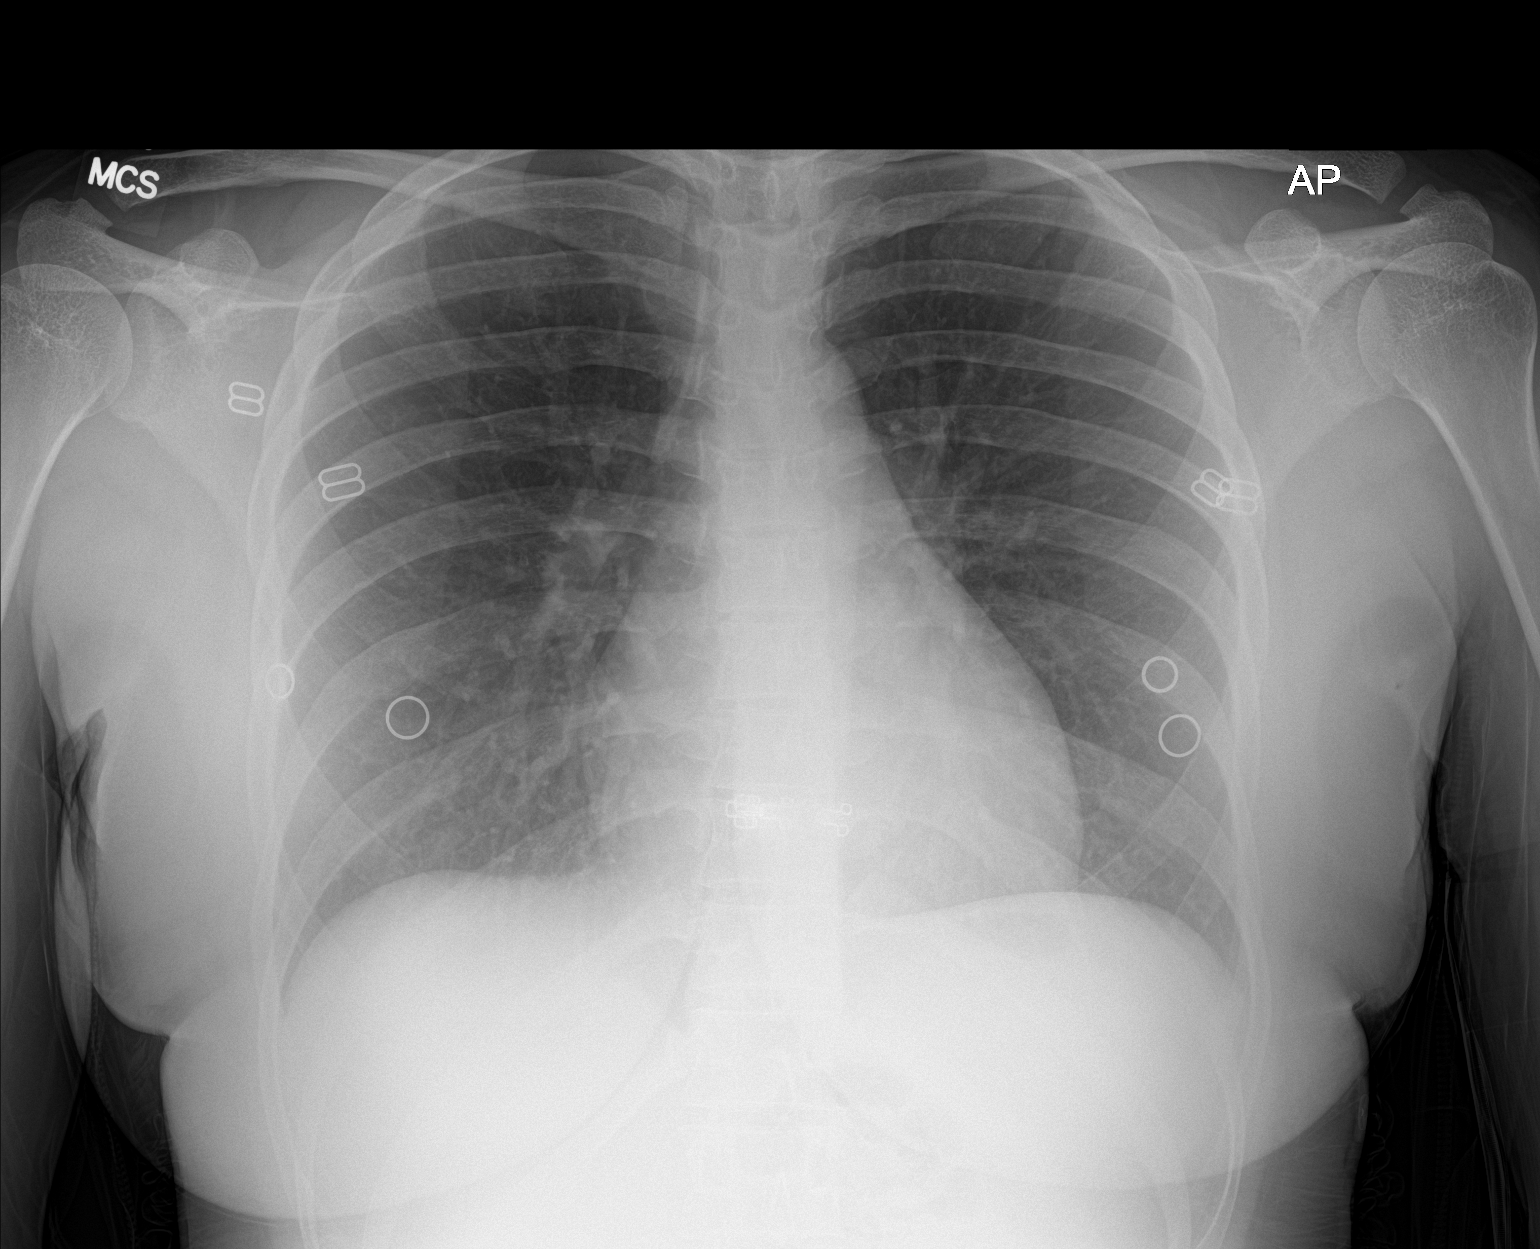

[1 of 1 positions shown; findings below may reference images not displayed]

FINDINGS: The heart size and mediastinal contours are within normal limits.
Both lungs are clear. The visualized skeletal structures are
unremarkable.
IMPRESSION: No active disease.
